# Patient Record
Sex: Male | Born: 1960 | Race: Black or African American | Hispanic: No | Smoking: Current every day smoker
Health system: Southern US, Community
[De-identification: ages and names within clinical notes are randomized; demographics above are authoritative.]

## PROBLEM LIST (undated history)

## (undated) DIAGNOSIS — F4310 Post-traumatic stress disorder, unspecified: Secondary | ICD-10-CM

## (undated) DIAGNOSIS — F419 Anxiety disorder, unspecified: Secondary | ICD-10-CM

## (undated) DIAGNOSIS — F32A Depression, unspecified: Secondary | ICD-10-CM

## (undated) DIAGNOSIS — F329 Major depressive disorder, single episode, unspecified: Secondary | ICD-10-CM

## (undated) DIAGNOSIS — F22 Delusional disorders: Secondary | ICD-10-CM

## (undated) DIAGNOSIS — F209 Schizophrenia, unspecified: Secondary | ICD-10-CM

## (undated) DIAGNOSIS — I10 Essential (primary) hypertension: Secondary | ICD-10-CM

## (undated) HISTORY — PX: OTHER SURGICAL HISTORY: SHX169

## (undated) HISTORY — PX: BACK SURGERY: SHX140

## (undated) HISTORY — PX: HERNIA REPAIR: SHX51

---

## 1998-02-02 ENCOUNTER — Emergency Department (HOSPITAL_COMMUNITY): Admission: EM | Admit: 1998-02-02 | Discharge: 1998-02-02 | Payer: Self-pay | Admitting: Emergency Medicine

## 2000-08-19 ENCOUNTER — Emergency Department (HOSPITAL_COMMUNITY): Admission: EM | Admit: 2000-08-19 | Discharge: 2000-08-19 | Payer: Self-pay | Admitting: Emergency Medicine

## 2000-08-19 ENCOUNTER — Encounter: Payer: Self-pay | Admitting: Emergency Medicine

## 2000-09-18 ENCOUNTER — Ambulatory Visit (HOSPITAL_BASED_OUTPATIENT_CLINIC_OR_DEPARTMENT_OTHER): Admission: RE | Admit: 2000-09-18 | Discharge: 2000-09-18 | Payer: Self-pay | Admitting: Orthopedic Surgery

## 2001-02-20 ENCOUNTER — Emergency Department (HOSPITAL_COMMUNITY): Admission: AC | Admit: 2001-02-20 | Discharge: 2001-02-21 | Payer: Self-pay

## 2001-03-25 ENCOUNTER — Ambulatory Visit (HOSPITAL_BASED_OUTPATIENT_CLINIC_OR_DEPARTMENT_OTHER): Admission: RE | Admit: 2001-03-25 | Discharge: 2001-03-25 | Payer: Self-pay | Admitting: Orthopedic Surgery

## 2002-11-10 ENCOUNTER — Inpatient Hospital Stay (HOSPITAL_COMMUNITY): Admission: EM | Admit: 2002-11-10 | Discharge: 2002-11-14 | Payer: Self-pay | Admitting: Psychiatry

## 2004-02-10 ENCOUNTER — Emergency Department (HOSPITAL_COMMUNITY): Admission: EM | Admit: 2004-02-10 | Discharge: 2004-02-11 | Payer: Self-pay

## 2005-04-22 ENCOUNTER — Emergency Department (HOSPITAL_COMMUNITY): Admission: EM | Admit: 2005-04-22 | Discharge: 2005-04-22 | Payer: Self-pay | Admitting: Emergency Medicine

## 2005-05-07 ENCOUNTER — Emergency Department (HOSPITAL_COMMUNITY): Admission: EM | Admit: 2005-05-07 | Discharge: 2005-05-07 | Payer: Self-pay | Admitting: Emergency Medicine

## 2005-05-09 ENCOUNTER — Encounter: Admission: RE | Admit: 2005-05-09 | Discharge: 2005-05-09 | Payer: Self-pay | Admitting: Gastroenterology

## 2005-05-28 ENCOUNTER — Encounter: Admission: RE | Admit: 2005-05-28 | Discharge: 2005-05-28 | Payer: Self-pay | Admitting: Neurosurgery

## 2005-06-02 ENCOUNTER — Ambulatory Visit (HOSPITAL_COMMUNITY): Admission: RE | Admit: 2005-06-02 | Discharge: 2005-06-03 | Payer: Self-pay | Admitting: Neurosurgery

## 2005-07-22 ENCOUNTER — Encounter: Admission: RE | Admit: 2005-07-22 | Discharge: 2005-07-22 | Payer: Self-pay | Admitting: Neurosurgery

## 2005-07-31 ENCOUNTER — Ambulatory Visit (HOSPITAL_COMMUNITY): Admission: RE | Admit: 2005-07-31 | Discharge: 2005-07-31 | Payer: Self-pay | Admitting: Gastroenterology

## 2006-11-08 ENCOUNTER — Emergency Department (HOSPITAL_COMMUNITY): Admission: EM | Admit: 2006-11-08 | Discharge: 2006-11-08 | Payer: Self-pay | Admitting: Emergency Medicine

## 2007-11-22 ENCOUNTER — Emergency Department (HOSPITAL_COMMUNITY): Admission: EM | Admit: 2007-11-22 | Discharge: 2007-11-22 | Payer: Self-pay | Admitting: Emergency Medicine

## 2008-07-01 ENCOUNTER — Emergency Department (HOSPITAL_COMMUNITY): Admission: EM | Admit: 2008-07-01 | Discharge: 2008-07-01 | Payer: Self-pay | Admitting: Emergency Medicine

## 2008-09-01 ENCOUNTER — Emergency Department (HOSPITAL_COMMUNITY): Admission: EM | Admit: 2008-09-01 | Discharge: 2008-09-01 | Payer: Self-pay | Admitting: Emergency Medicine

## 2008-09-05 ENCOUNTER — Emergency Department (HOSPITAL_COMMUNITY): Admission: EM | Admit: 2008-09-05 | Discharge: 2008-09-05 | Payer: Self-pay | Admitting: Emergency Medicine

## 2009-08-14 ENCOUNTER — Emergency Department (HOSPITAL_COMMUNITY): Admission: EM | Admit: 2009-08-14 | Discharge: 2009-08-15 | Payer: Self-pay | Admitting: Emergency Medicine

## 2010-07-20 ENCOUNTER — Encounter: Payer: Self-pay | Admitting: Gastroenterology

## 2010-10-10 LAB — URINALYSIS, ROUTINE W REFLEX MICROSCOPIC
Bilirubin Urine: NEGATIVE
Glucose, UA: NEGATIVE mg/dL
Ketones, ur: NEGATIVE mg/dL
Protein, ur: NEGATIVE mg/dL
pH: 6 (ref 5.0–8.0)

## 2010-10-10 LAB — DIFFERENTIAL
Basophils Relative: 1 % (ref 0–1)
Eosinophils Relative: 2 % (ref 0–5)
Monocytes Absolute: 0.5 10*3/uL (ref 0.1–1.0)
Monocytes Relative: 9 % (ref 3–12)
Neutro Abs: 3.4 10*3/uL (ref 1.7–7.7)

## 2010-10-10 LAB — POCT I-STAT, CHEM 8
BUN: 12 mg/dL (ref 6–23)
Calcium, Ion: 1.16 mmol/L (ref 1.12–1.32)
Chloride: 106 mEq/L (ref 96–112)
Glucose, Bld: 102 mg/dL — ABNORMAL HIGH (ref 70–99)
TCO2: 26 mmol/L (ref 0–100)

## 2010-10-10 LAB — CBC
HCT: 42.1 % (ref 39.0–52.0)
Hemoglobin: 15 g/dL (ref 13.0–17.0)
MCHC: 35.6 g/dL (ref 30.0–36.0)
RBC: 4.92 MIL/uL (ref 4.22–5.81)
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
## 2010-11-15 NOTE — Op Note (Signed)
NAMELYNDELL, ALLAIRE                 ACCOUNT NO.:  [id number]   MEDICAL RECORD NO.:  [id number]          PATIENT TYPE:  OIB   LOCATION:  3008                         FACILITY:  [facility]   PHYSICIAN:  Donalee Citrin, M.D.        DATE OF BIRTH:  1961/03/10   DATE OF PROCEDURE:  06/02/2005  DATE OF DISCHARGE:                                 OPERATIVE REPORT   PREOPERATIVE DIAGNOSIS:  Right-sided L5 radiculopathy from large herniated  disc L4-5 right.   PROCEDURE:  Lumbar laminectomy and microdiscectomy L4-5 right, microscopic  dissection of the right L5 nerve root and right sided microscopic  discectomy.   SURGEON:  Donalee Citrin, M.D.   ASSISTANT:  Kathaleen Maser. Pool, M.D.   ANESTHESIA:  General endotracheal anesthesia.   Patient is a very pleasant 50 year old gentleman who has had progressive  worsening back and right-sided leg pain radiating down the top of his foot  and his big toe with weakness in his EHL and progressive worsening limping  with the right leg.  Preoperative imaging showed a very large ruptured disc  causing severe spinal stenosis and foraminal stenosis on the L5 nerve root.  Patient is recommended laminectomy and microdiscectomy after he failed all  forms of conservative treatment and having progressive worsening weakness of  the right foot.  Risks and benefits explained to the patient who understands  and agrees to proceed forward.   Patient brought to the OR and was induced with general endotracheal  anesthesia.  Positioned prone on Wilson frame.  Back prepped and draped in  usual sterile fashion.  Preoperative x-ray localized the L4-5 disc space.  After infiltration of anesthesia with lidocaine with epinephrine, a midline  incision was made.  Bovie electrocautery was used to take down subcutaneous  tissue.  Subperiosteal dissection carried out at lamina of L4 and L5 on the  right.  Intraoperative x-ray confirmed localization of the L4-5 disc space  and __________ lamina of  L4 medial facet complex.  Element of L5 was removed  piecemeal fashion exposing the ligamentum flavum.  This was then removed in  piecemeal fashion under microscopic illumination exposing the thecal sac and  proximal L5 nerve root.  The L5 nerve root was markedly tense and displaced  by the large disc rupture so this was teased off of the disc with a #4  Penfield __________ with a D-Errico nerve root __________ annulotomy was  made.  Several large fragments of disc were removed medially from the disc  space and the central compartment.  Then using a downgoing Epstein curette  and pituitary rongeurs, the remainder of the disc space was cleaned out  extensively, both medially, laterally and cephalocaudally.  Then at the end  of the discectomy, the neural foramen was explored with a coronary dilator  and hockey stick and there was no further stenosis appreciated.  Wound was  then copiously irrigated and meticulous  hemostasis was maintained.  Gelfoam was overlayed on top of the dura.  The  muscle and fascia reapproximated with interrupted Vicryls and the skin was  closed with  running 4-0 subcuticular.  Benzoin and Steri-Strips applied.  The patient went to the recovery room in stable condition.           ______________________________  Donalee Citrin, M.D.     GC/MEDQ  D:  06/02/2005  T:  06/03/2005  Job:  [id number]
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
## 2010-11-15 NOTE — Op Note (Signed)
[place]  Patient:    Calvin Erickson, Calvin Erickson                        MRN: [id number] Proc. Date: 09/18/00 Adm. Date:  [id number] Attending:  Milly Jakob                           Operative Report  PREOPERATIVE DIAGNOSIS:  Severe ankle pain with loose bodies and significant degenerative spur formation.  POSTOPERATIVE DIAGNOSIS: Severe ankle pain with loose bodies and significant degenerative spur formation.  OPERATION:  Debridement of significant osteophytic spurs and removal of loose body via ankle arthroscopy.  SURGEON:  Harvie Junior, M.D.  ASSISTANT:  Currie Paris. Thedore Mins.  ANESTHESIA:  General.  INDICATIONS:  Calvin Erickson is a 50 year old male with a long history of having intermittent catching and locking ankle pain.  He has been evaluated and noted to have fairly significant degenerative spurring in the ankle but, because of intermittent catching and locking pain, he was evaluated and had a steroid injection done.  The steroid injection gave him excellent initial relief, but his symptoms recurred.  Because of that, he was brought to the operating room for operative ankle arthroscopy.  DESCRIPTION OF PROCEDURE:    The patient was taken to the operating room. After adequate anesthesia was obtained with a general anesthesia, the patient was placed supine on the operating room table.  The left leg was then prepped and draped in the usual sterile fashion.  The patient was placed into the stirrup leg holder.  Following this, examination of the ankle revealed that there was multiple osteocartilaginous loose bodies which were removed initially.  There was one large osteocartilaginous loose body in the lateral gutter which was grasped but was unable to be removed through the portal.  The portal was enlarged, and then it was removed.  Attention was then turned to the rest of the ankle joint where there was noted to be a fairly significant  degenerative spur on the anteromedial talus and a corresponding spur on the anteromedial tibia.  These spurs were debrided with a motorized 4.2 mm hooded vortex bur, and the fluoroscope was used to localize and make sure that the osteophytes were removed appropriately.  Following this, x-ray was used to make sure that the osteophytes were removed from the talus as well as from the tibia.  Once this had been accomplished, the remaining ankle joint was examined.  The medial gutter was felt to be within normal limits.  There were two large cartilaginous pieces on his AP x-ray felt to be within the deltoid ligament.  This was probed thoroughly on the medial side, and these actually were not loose pieces, but they were within the substance of the deltoid ligament and not able to be visualized within the ankle joint.  Attention was then turned to the lateral gutter which had been opened quite nicely with the arthroscopy, and the loose pieces were removed there. Attention was then turned centrally where the cartilage actually looked reasonable on the talus and the tibia.  At this point, the ankle was copiously irrigated and suction dried.  The arthroscopic portals were closed deep with 4-0 Vicryl interrupted suture and then subcutaneous tissue with 4-0 nylon interrupted suture.  Sterile compressive dressing was applied and a posterior plaster splint, and the patient was taken to the recovery room where he was noted to be  satisfactory condition.  Estimated blood loss during procedure was none.  The procedure was done under tourniquet control, and this was accomplished with Esmarch exsanguination of the extremity and inflating the blood pressure tourniquet to 325 mmHg, and the approximate tourniquet time was 1 hour and 30 minutes. DD:  09/18/00 TD:  09/19/00 Job: [id number]
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
## 2010-12-21 ENCOUNTER — Emergency Department (HOSPITAL_COMMUNITY): Payer: Medicare Other

## 2010-12-21 ENCOUNTER — Inpatient Hospital Stay (HOSPITAL_COMMUNITY)
Admission: EM | Admit: 2010-12-21 | Discharge: 2010-12-24 | DRG: 501 | Disposition: A | Discharge status: Home / Self Care | Payer: Medicare Other | Attending: Internal Medicine | Admitting: Internal Medicine

## 2010-12-21 DIAGNOSIS — I10 Essential (primary) hypertension: Secondary | ICD-10-CM | POA: Diagnosis present

## 2010-12-21 DIAGNOSIS — F1010 Alcohol abuse, uncomplicated: Secondary | ICD-10-CM | POA: Diagnosis present

## 2010-12-21 DIAGNOSIS — S51809A Unspecified open wound of unspecified forearm, initial encounter: Secondary | ICD-10-CM | POA: Diagnosis present

## 2010-12-21 DIAGNOSIS — D62 Acute posthemorrhagic anemia: Secondary | ICD-10-CM | POA: Diagnosis not present

## 2010-12-21 DIAGNOSIS — Z1881 Retained glass fragments: Secondary | ICD-10-CM

## 2010-12-21 DIAGNOSIS — F4310 Post-traumatic stress disorder, unspecified: Secondary | ICD-10-CM | POA: Diagnosis present

## 2010-12-21 DIAGNOSIS — F209 Schizophrenia, unspecified: Secondary | ICD-10-CM | POA: Diagnosis present

## 2010-12-21 DIAGNOSIS — Z91199 Patient's noncompliance with other medical treatment and regimen due to unspecified reason: Secondary | ICD-10-CM

## 2010-12-21 DIAGNOSIS — Z9119 Patient's noncompliance with other medical treatment and regimen: Secondary | ICD-10-CM

## 2010-12-21 LAB — URINE MICROSCOPIC-ADD ON

## 2010-12-21 LAB — URINALYSIS, ROUTINE W REFLEX MICROSCOPIC
Hgb urine dipstick: NEGATIVE
Ketones, ur: 15 mg/dL — AB
Protein, ur: 30 mg/dL — AB
Urobilinogen, UA: 0.2 mg/dL (ref 0.0–1.0)

## 2010-12-21 LAB — BASIC METABOLIC PANEL
BUN: 14 mg/dL (ref 6–23)
Calcium: 8.5 mg/dL (ref 8.4–10.5)
GFR calc Af Amer: >60 mL/min (ref >60)
GFR calc non Af Amer: >60 mL/min (ref >60)
Potassium: 4.1 mEq/L (ref 3.5–5.1)
Sodium: 140 mEq/L (ref 135–145)

## 2010-12-21 LAB — RAPID URINE DRUG SCREEN, HOSP PERFORMED
Amphetamines: NOT DETECTED
Barbiturates: NOT DETECTED
Benzodiazepines: NOT DETECTED
Cocaine: NOT DETECTED
Tetrahydrocannabinol: NOT DETECTED

## 2010-12-21 LAB — DIFFERENTIAL
Basophils Relative: 0 % (ref 0–1)
Eosinophils Absolute: 0 10*3/uL (ref 0.0–0.7)
Neutrophils Relative %: 88 % — ABNORMAL HIGH (ref 43–77)

## 2010-12-21 LAB — CBC
MCH: 29.5 pg (ref 26.0–34.0)
Platelets: 181 10*3/uL (ref 150–400)
RBC: 4.04 MIL/uL — ABNORMAL LOW (ref 4.22–5.81)
RDW: 13.3 % (ref 11.5–15.5)
WBC: 7.1 10*3/uL (ref 4.0–10.5)

## 2010-12-21 LAB — ACETAMINOPHEN LEVEL: Acetaminophen (Tylenol), Serum: <15 ug/mL (ref 10–30)

## 2010-12-22 DIAGNOSIS — F29 Unspecified psychosis not due to a substance or known physiological condition: Secondary | ICD-10-CM

## 2010-12-22 LAB — COMPREHENSIVE METABOLIC PANEL
AST: 21 U/L (ref 0–37)
Albumin: 3.4 g/dL — ABNORMAL LOW (ref 3.5–5.2)
Alkaline Phosphatase: 61 U/L (ref 39–117)
Chloride: 101 mEq/L (ref 96–112)
Potassium: 4.2 mEq/L (ref 3.5–5.1)
Sodium: 136 mEq/L (ref 135–145)
Total Bilirubin: 0.5 mg/dL (ref 0.3–1.2)
Total Protein: 6 g/dL (ref 6.0–8.3)

## 2010-12-22 LAB — CBC
MCHC: 34.8 g/dL (ref 30.0–36.0)
Platelets: 198 10*3/uL (ref 150–400)
RDW: 13.4 % (ref 11.5–15.5)
WBC: 9.5 10*3/uL (ref 4.0–10.5)

## 2010-12-22 LAB — ETHANOL: Alcohol, Ethyl (B): <11 mg/dL (ref 0–11)
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
## 2010-12-24 DIAGNOSIS — F29 Unspecified psychosis not due to a substance or known physiological condition: Secondary | ICD-10-CM

## 2010-12-24 LAB — BASIC METABOLIC PANEL
BUN: 9 mg/dL (ref 6–23)
CO2: 28 mEq/L (ref 19–32)
Calcium: 8.4 mg/dL (ref 8.4–10.5)
Chloride: 103 mEq/L (ref 96–112)
Creatinine, Ser: 0.95 mg/dL (ref 0.50–1.35)
GFR calc Af Amer: >60 mL/min (ref >60)
GFR calc non Af Amer: >60 mL/min (ref >60)
Glucose, Bld: 105 mg/dL — ABNORMAL HIGH (ref 70–99)
Potassium: 3.9 mEq/L (ref 3.5–5.1)
Sodium: 136 mEq/L (ref 135–145)

## 2010-12-24 LAB — CBC
HCT: 27.3 % — ABNORMAL LOW (ref 39.0–52.0)
Hemoglobin: 9.6 g/dL — ABNORMAL LOW (ref 13.0–17.0)
MCHC: 35.2 g/dL (ref 30.0–36.0)
MCV: 85 fL (ref 78.0–100.0)
RDW: 13 % (ref 11.5–15.5)

## 2010-12-25 ENCOUNTER — Ambulatory Visit: Payer: Medicare Other | Attending: Orthopedic Surgery | Admitting: Occupational Therapy

## 2010-12-25 DIAGNOSIS — IMO0001 Reserved for inherently not codable concepts without codable children: Secondary | ICD-10-CM | POA: Insufficient documentation

## 2010-12-25 DIAGNOSIS — M25549 Pain in joints of unspecified hand: Secondary | ICD-10-CM | POA: Insufficient documentation

## 2010-12-25 DIAGNOSIS — M2560 Stiffness of unspecified joint, not elsewhere classified: Secondary | ICD-10-CM | POA: Insufficient documentation

## 2010-12-25 DIAGNOSIS — M6281 Muscle weakness (generalized): Secondary | ICD-10-CM | POA: Insufficient documentation

## 2010-12-30 ENCOUNTER — Encounter: Payer: Medicare Other | Admitting: Occupational Therapy

## 2010-12-30 ENCOUNTER — Emergency Department (HOSPITAL_COMMUNITY)
Admission: EM | Admit: 2010-12-30 | Discharge: 2010-12-30 | Disposition: A | Discharge status: Home / Self Care | Payer: Medicare Other | Attending: Emergency Medicine | Admitting: Emergency Medicine

## 2010-12-30 DIAGNOSIS — M79609 Pain in unspecified limb: Secondary | ICD-10-CM | POA: Insufficient documentation

## 2010-12-30 DIAGNOSIS — M545 Low back pain, unspecified: Secondary | ICD-10-CM | POA: Insufficient documentation

## 2010-12-30 DIAGNOSIS — M5380 Other specified dorsopathies, site unspecified: Secondary | ICD-10-CM | POA: Insufficient documentation

## 2010-12-30 LAB — URINALYSIS, ROUTINE W REFLEX MICROSCOPIC
Bilirubin Urine: NEGATIVE
Hgb urine dipstick: NEGATIVE
Ketones, ur: NEGATIVE mg/dL
Nitrite: NEGATIVE
Specific Gravity, Urine: 1.025 (ref 1.005–1.030)
pH: 6.5 (ref 5.0–8.0)
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
## 2011-01-06 ENCOUNTER — Ambulatory Visit: Payer: Medicare Other | Attending: Orthopedic Surgery | Admitting: Occupational Therapy

## 2011-01-06 DIAGNOSIS — IMO0001 Reserved for inherently not codable concepts without codable children: Secondary | ICD-10-CM | POA: Insufficient documentation

## 2011-01-06 DIAGNOSIS — M2560 Stiffness of unspecified joint, not elsewhere classified: Secondary | ICD-10-CM | POA: Insufficient documentation

## 2011-01-06 DIAGNOSIS — M6281 Muscle weakness (generalized): Secondary | ICD-10-CM | POA: Insufficient documentation

## 2011-01-06 DIAGNOSIS — M25549 Pain in joints of unspecified hand: Secondary | ICD-10-CM | POA: Insufficient documentation

## 2011-01-08 ENCOUNTER — Ambulatory Visit: Payer: Medicare Other | Admitting: Occupational Therapy
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
## 2011-01-14 ENCOUNTER — Ambulatory Visit: Payer: Medicare Other | Admitting: Occupational Therapy
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
## 2011-01-21 NOTE — Op Note (Signed)
  [name], Calvin Erickson NO.:  [id number]  MEDICAL RECORD NO.:  [id number]  LOCATION:  5522                         FACILITY:  [facility]  PHYSICIAN:  Artist Pais. Dupree Givler, M.D.DATE OF BIRTH:  1960-09-29  DATE OF PROCEDURE:  12/21/2010 DATE OF DISCHARGE:                              OPERATIVE REPORT   PREOPERATIVE DIAGNOSIS:  Complex laceration, right forearm.  POSTOPERATIVE DIAGNOSIS:  Complex laceration, right forearm.  PROCEDURE:  Exploration and repair with removal of significant amounts of glass as well as debridement of devitalized tissue and primary repair of EPL, EDC x4, EIP, EDQ, ECU, and FCU, right forearm.  SURGEON:  Artist Pais. Mina Marble, MD  ASSISTANT:  None.  ANESTHESIA:  General.  TOURNIQUET TIME:  An hour and 25 minutes.  COMPLICATIONS:  None.  DRAINS:  None.  The patient was taken to operating suite after induction of adequate general anesthesia.  Right upper extremity was prepped and draped insterile fashion.  Esmarch was used to exsanguinate the limb.  Tourniquet was inflated to 250 mmHg.  At this point in time, a complex laceration of the dorsal ulnar border of the forearm and wrist area was explored with complex flaps, some proximally based and distally based.  These were carefully retracted.  There was significant amount of glass in the wound.  The proximal aspect of the wound was extended in the midline, volar aspect in the midline as well along the FCU tendon.  Exploration the volar side revealed a laceration about 70% of the FCU tendon. Neurovascular bundle was intact.  This was repaired with 3-0 FiberWire suture and horizontal mattress sutures x2.  The wound was then explored dorsally.  3 L of normal saline was used to pulse lavage the wound clean including again large amounts of glass, which were debrided out of the wound.  At this point in time, inspection revealed complete lacerations of the third, fourth, fifth, and sixth dorsal  compartments.  All the extensor tendons were carefully identified.  There was some segmental damage to the EDC tendons to the long and ring finger.  The distal lacerations were repaired with 3-0 FiberWire and horizontal mattress sutures.  The proximal junctures were all repaired with the same 3-0 FiberWire in combination of horizontal mattress sutures and running locked epitendinous-type sutures using the 3-0 FiberWire suture.  There was some devitalized muscle that was debrided, and the wound was then thoroughly irrigated and loosely closed with 4-0 Vicryl Rapide sutures. Xeroform, 4 x 4s, fluffs, and a splint was applied with the wrist in extension, the MP joints in extension, and the thumb in extension to protect the repairs.  The patient tolerated the procedure well and went to the recovery room in stable fashion.     Artist Pais Mina Marble, M.D.     MAW/MEDQ  D:  12/22/2010  T:  12/22/2010  Job:  [id number]  Electronically Signed by Dairl Ponder M.D. on 01/21/2011 08:37:42 PM

## 2011-01-22 ENCOUNTER — Encounter: Payer: Medicare Other | Admitting: Occupational Therapy

## 2011-01-24 ENCOUNTER — Encounter: Payer: Medicare Other | Admitting: Occupational Therapy

## 2011-01-28 ENCOUNTER — Encounter: Payer: Medicare Other | Admitting: Occupational Therapy

## 2011-01-30 ENCOUNTER — Encounter: Payer: Medicare Other | Admitting: Occupational Therapy

## 2011-02-04 ENCOUNTER — Encounter: Payer: Medicare Other | Admitting: Occupational Therapy

## 2011-02-06 ENCOUNTER — Encounter: Payer: Medicare Other | Admitting: Occupational Therapy

## 2012-09-13 IMAGING — CR DG ELBOW COMPLETE 3+V*R*
4 series · 4 of 4 positions shown · non-contrast
Comparison: None.

CLINICAL DATA: Laceration with glass

RIGHT ELBOW - COMPLETE 3+ VIEW

[view not recorded (1 of 4)]
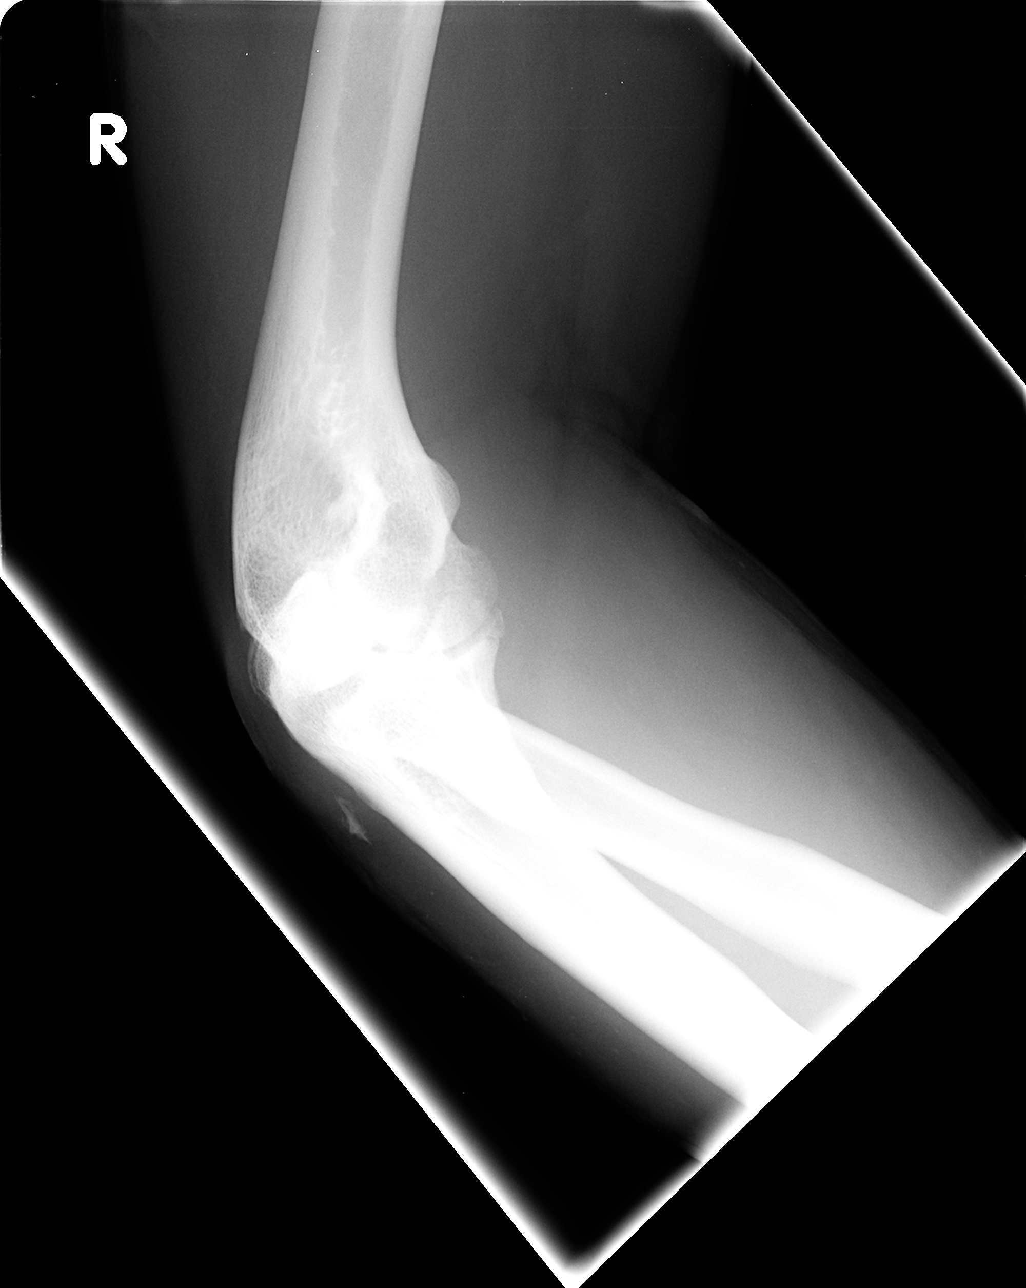

[view not recorded (2 of 4)]
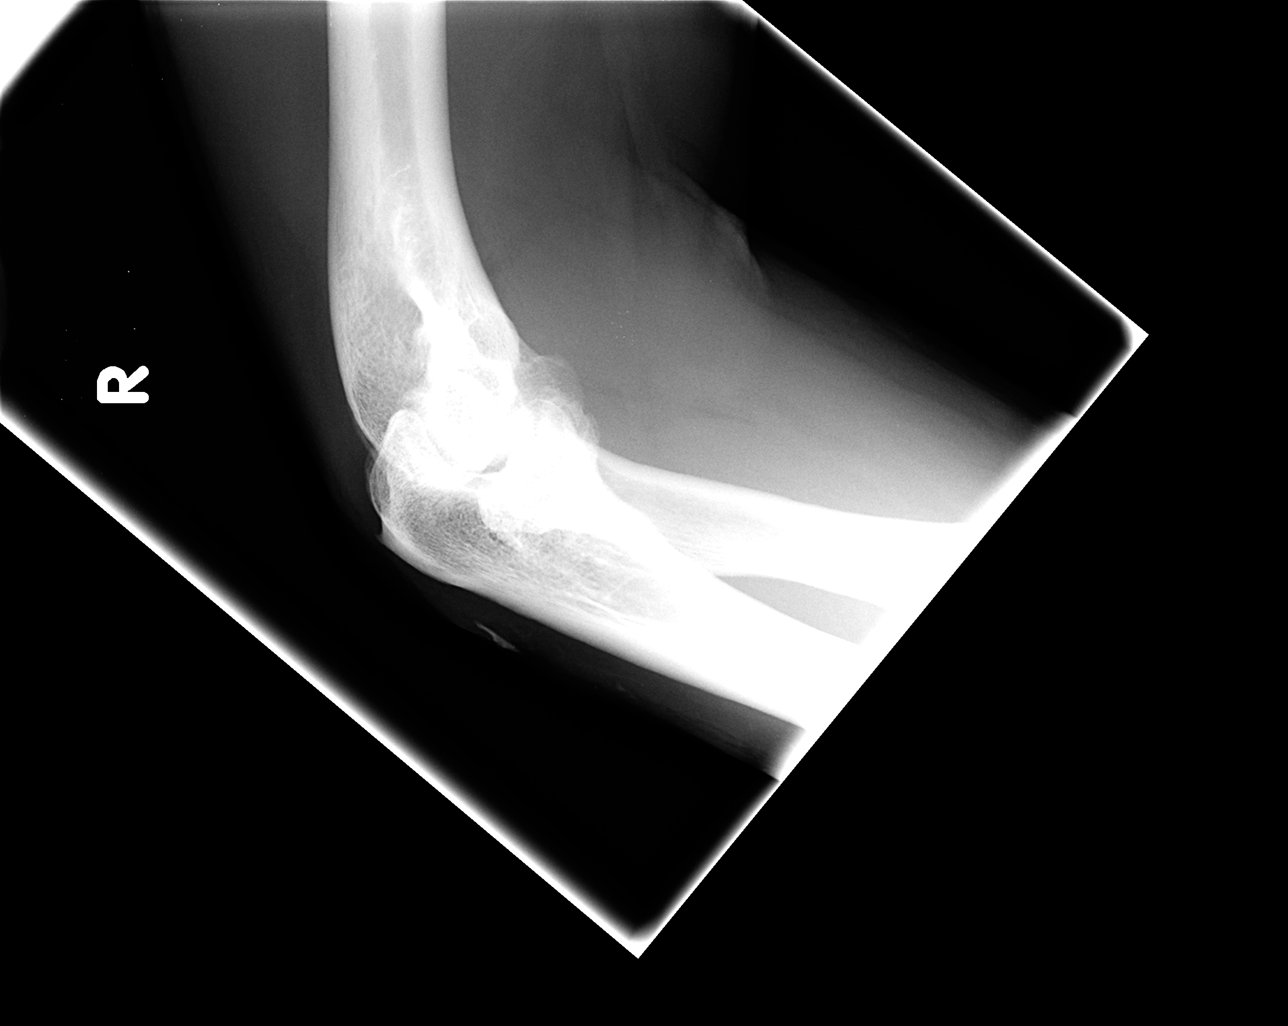

[view not recorded (3 of 4)]
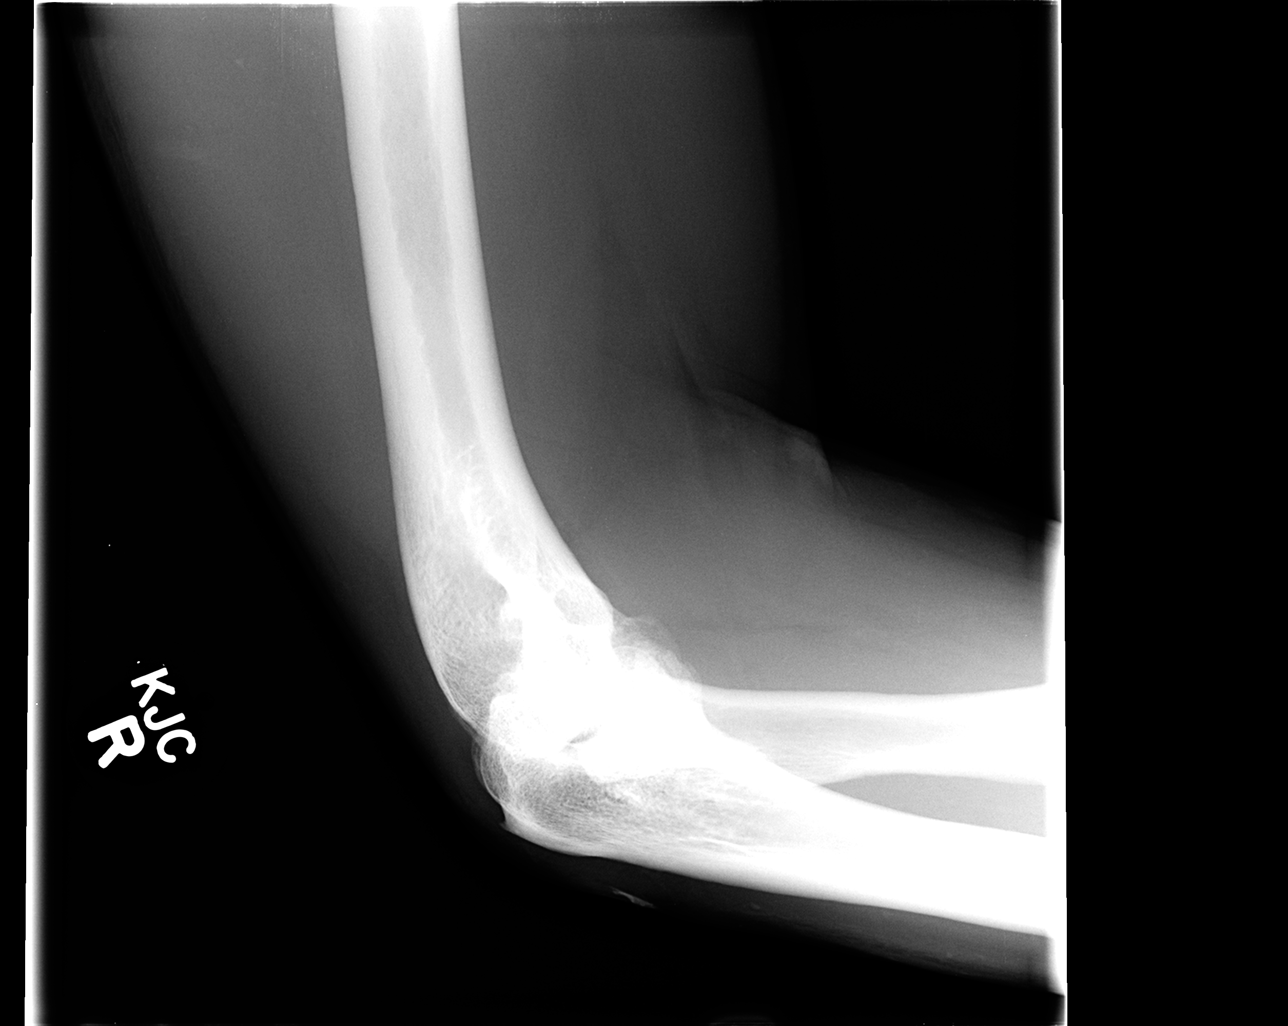

[view not recorded (4 of 4)]
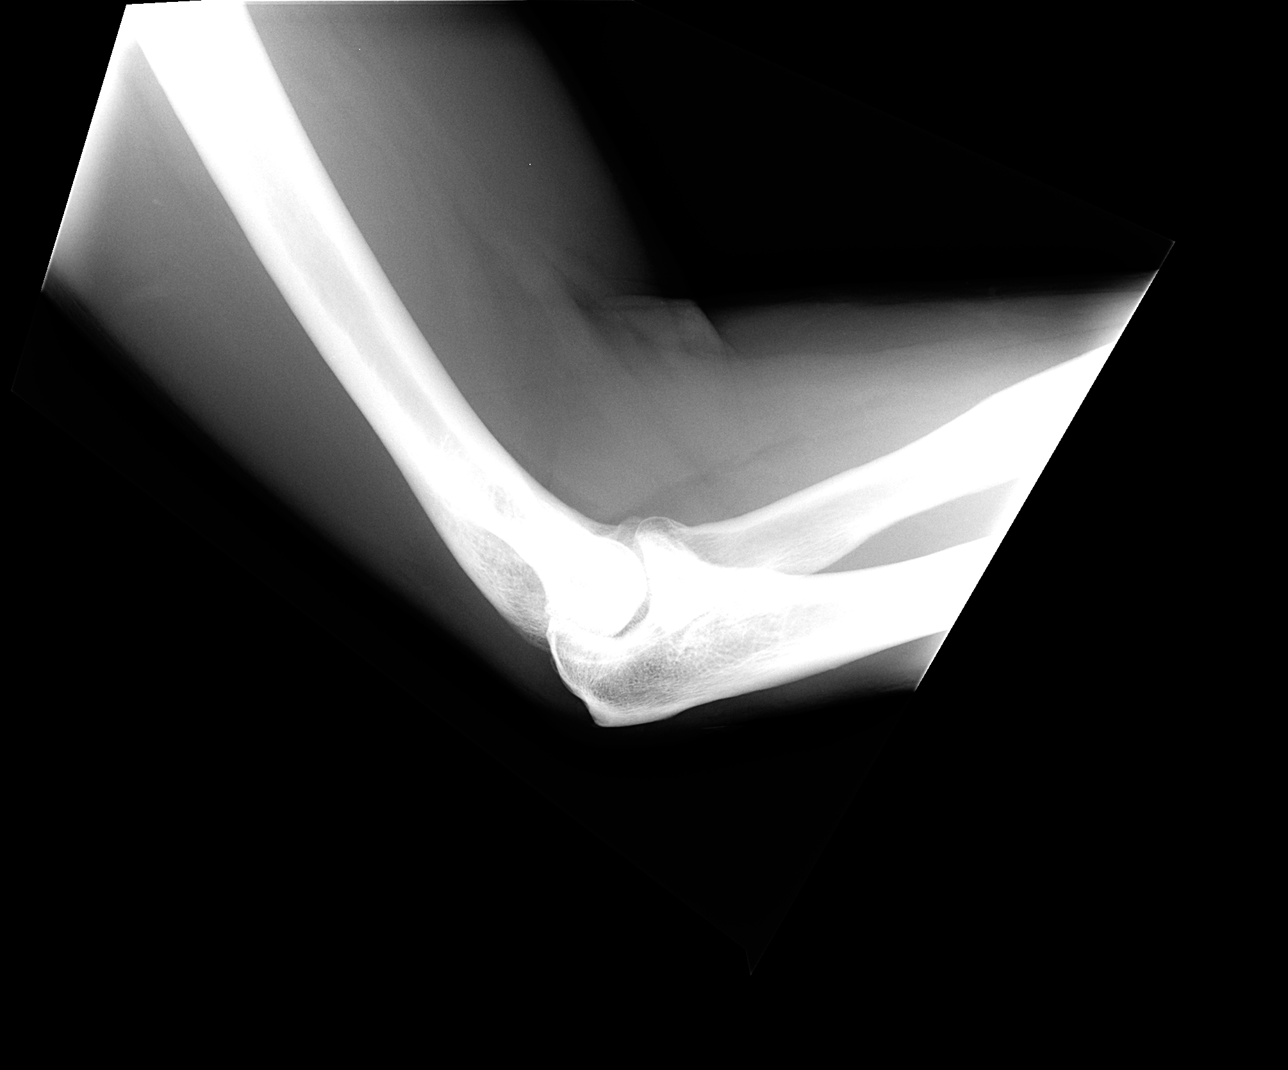

[4 of 4 positions shown; findings below may reference images not displayed]

FINDINGS: Linear radiopaque foreign body is noted over the dorsal
soft tissues overlying the elbow.  Allowing for suboptimal
positioning, no fracture or dislocation is seen.  Positioning is
markedly suboptimal due to patient inability to cooperate with the
examination.
IMPRESSION: Curvilinear radiopaque foreign body of the dorsal soft tissues over
the level.

## 2012-09-13 IMAGING — CR DG FOREARM 2V*R*
2 series · 2 of 2 positions shown · non-contrast
Comparison: None.

CLINICAL DATA: Laceration to the right forearm with glass

RIGHT FOREARM - 2 VIEW

[view not recorded (1 of 2)]
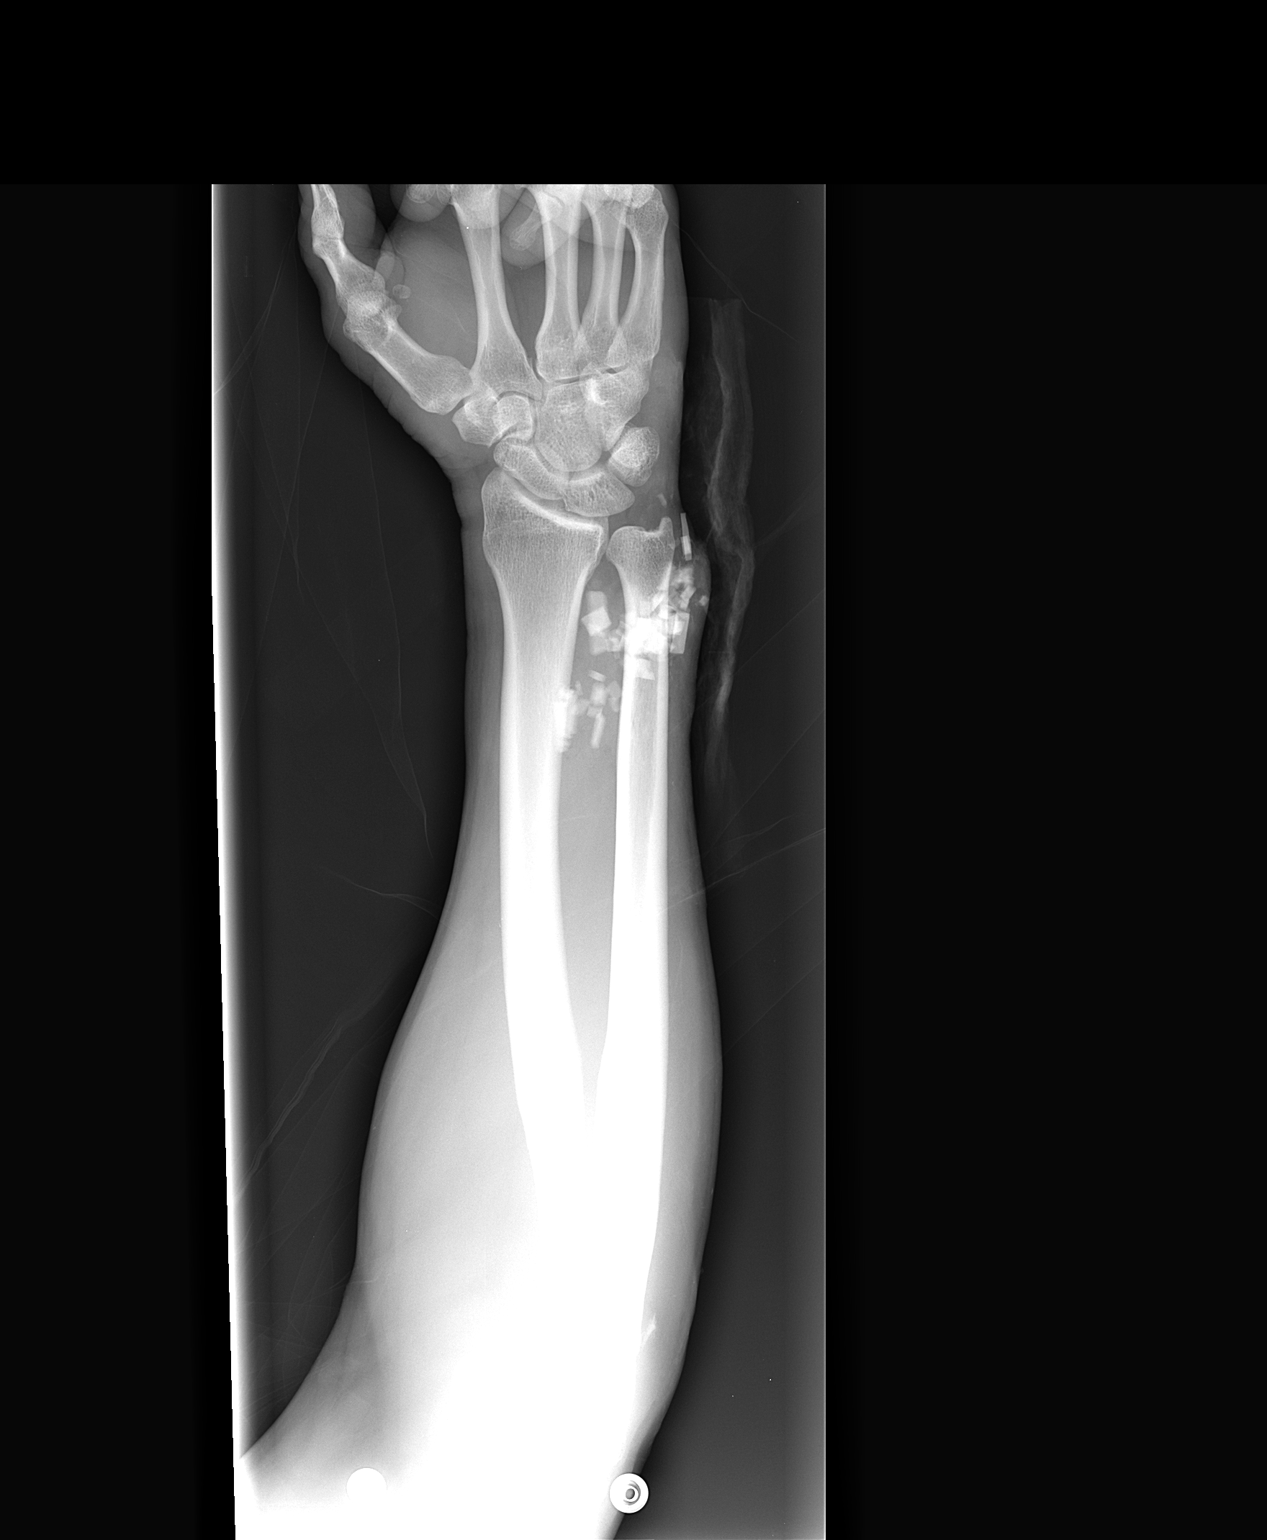

[view not recorded (2 of 2)]
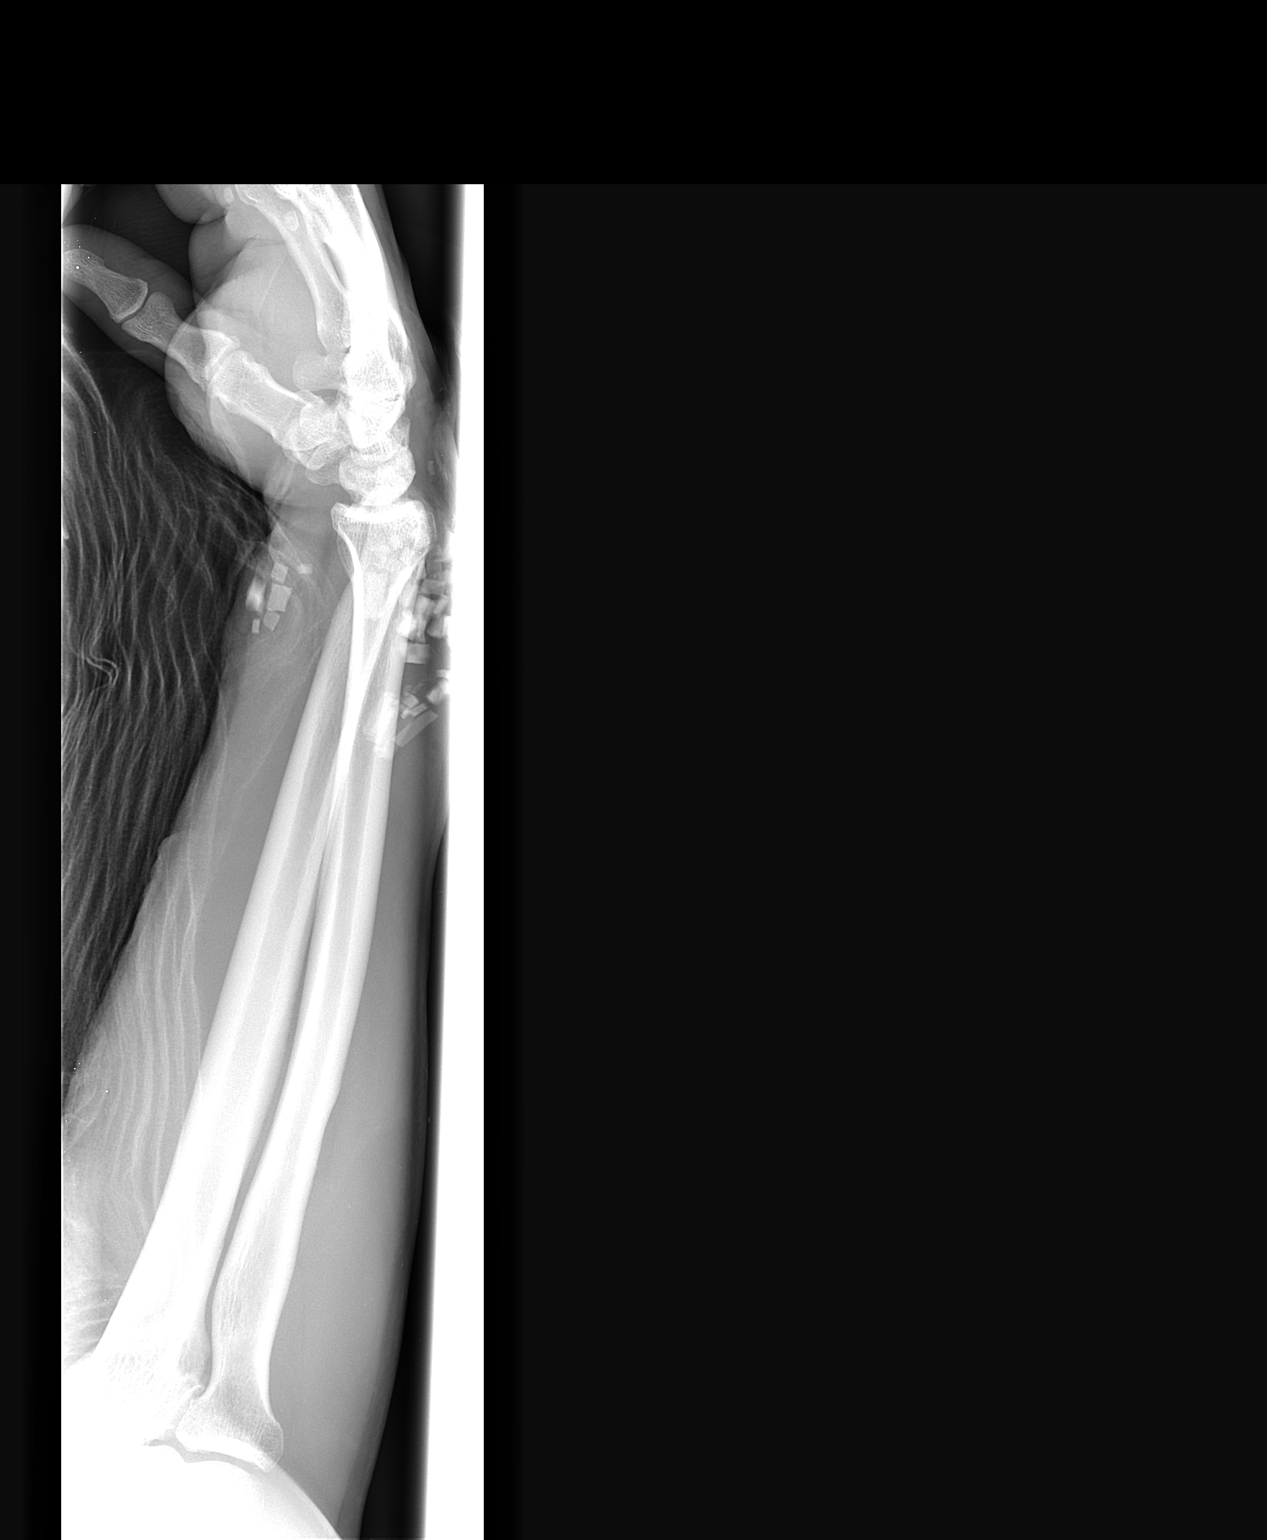

[2 of 2 positions shown; findings below may reference images not displayed]

FINDINGS: Multiple radiopaque foreign bodies overlie the soft
tissues over the distal right forearm.  Overlying soft tissue
deformity is noted.  Some detail is obscured by bandage material.
No displaced fracture is seen.  There is also possible radiopaque
foreign body projecting over the proximal forearm, seen on the
frontal view.
IMPRESSION: Multiple radiopaque foreign bodies over the soft tissues of the
forearm as above.  No visualized fracture line.  Consider re-
imaging after removal foreign bodies due to possible obscuration of
fracture.

## 2013-09-12 ENCOUNTER — Encounter (HOSPITAL_COMMUNITY): Payer: Self-pay | Admitting: Emergency Medicine

## 2013-09-12 ENCOUNTER — Emergency Department (EMERGENCY_DEPARTMENT_HOSPITAL)
Admission: EM | Admit: 2013-09-12 | Discharge: 2013-09-13 | Disposition: A | Discharge status: Other Acute Inpatient Hospital | Payer: Medicare Other | Source: Home / Self Care

## 2013-09-12 DIAGNOSIS — F22 Delusional disorders: Secondary | ICD-10-CM

## 2013-09-12 DIAGNOSIS — F17200 Nicotine dependence, unspecified, uncomplicated: Secondary | ICD-10-CM | POA: Insufficient documentation

## 2013-09-12 DIAGNOSIS — F4310 Post-traumatic stress disorder, unspecified: Secondary | ICD-10-CM | POA: Diagnosis present

## 2013-09-12 DIAGNOSIS — R45850 Homicidal ideations: Secondary | ICD-10-CM

## 2013-09-12 DIAGNOSIS — F419 Anxiety disorder, unspecified: Secondary | ICD-10-CM | POA: Diagnosis present

## 2013-09-12 DIAGNOSIS — F209 Schizophrenia, unspecified: Secondary | ICD-10-CM | POA: Diagnosis present

## 2013-09-12 DIAGNOSIS — F1010 Alcohol abuse, uncomplicated: Secondary | ICD-10-CM | POA: Insufficient documentation

## 2013-09-12 DIAGNOSIS — R45851 Suicidal ideations: Secondary | ICD-10-CM

## 2013-09-12 DIAGNOSIS — F200 Paranoid schizophrenia: Secondary | ICD-10-CM

## 2013-09-12 DIAGNOSIS — F29 Unspecified psychosis not due to a substance or known physiological condition: Secondary | ICD-10-CM

## 2013-09-12 DIAGNOSIS — F1210 Cannabis abuse, uncomplicated: Secondary | ICD-10-CM | POA: Insufficient documentation

## 2013-09-12 DIAGNOSIS — F411 Generalized anxiety disorder: Secondary | ICD-10-CM | POA: Insufficient documentation

## 2013-09-12 HISTORY — DX: Schizophrenia, unspecified: F20.9

## 2013-09-12 HISTORY — DX: Anxiety disorder, unspecified: F41.9

## 2013-09-12 HISTORY — DX: Depression, unspecified: F32.A

## 2013-09-12 HISTORY — DX: Post-traumatic stress disorder, unspecified: F43.10

## 2013-09-12 HISTORY — DX: Major depressive disorder, single episode, unspecified: F32.9

## 2013-09-12 HISTORY — DX: Delusional disorders: F22

## 2013-09-12 LAB — CBC
HCT: 45 % (ref 39.0–52.0)
Hemoglobin: 16 g/dL (ref 13.0–17.0)
MCH: 31.9 pg (ref 26.0–34.0)
MCHC: 35.6 g/dL (ref 30.0–36.0)
MCV: 89.6 fL (ref 78.0–100.0)
Platelets: 263 K/uL (ref 150–400)
RBC: 5.02 MIL/uL (ref 4.22–5.81)
RDW: 14 % (ref 11.5–15.5)
WBC: 5.4 K/uL (ref 4.0–10.5)

## 2013-09-12 LAB — COMPREHENSIVE METABOLIC PANEL
ALBUMIN: 4.4 g/dL (ref 3.5–5.2)
ALK PHOS: 74 U/L (ref 39–117)
ALT: 17 U/L (ref 0–53)
AST: 34 U/L (ref 0–37)
BUN: 9 mg/dL (ref 6–23)
CO2: 21 mEq/L (ref 19–32)
Calcium: 9.5 mg/dL (ref 8.4–10.5)
Chloride: 99 mEq/L (ref 96–112)
Creatinine, Ser: 0.89 mg/dL (ref 0.50–1.35)
GFR calc Af Amer: >90 mL/min (ref >90)
GFR calc non Af Amer: >90 mL/min (ref >90)
Glucose, Bld: 96 mg/dL (ref 70–99)
POTASSIUM: 4.7 meq/L (ref 3.7–5.3)
SODIUM: 136 meq/L — AB (ref 137–147)
TOTAL PROTEIN: 7.8 g/dL (ref 6.0–8.3)
Total Bilirubin: 0.5 mg/dL (ref 0.3–1.2)

## 2013-09-12 LAB — RAPID URINE DRUG SCREEN, HOSP PERFORMED
Amphetamines: NOT DETECTED
BARBITURATES: NOT DETECTED
BENZODIAZEPINES: NOT DETECTED
COCAINE: NOT DETECTED
Opiates: NOT DETECTED
TETRAHYDROCANNABINOL: POSITIVE — AB

## 2013-09-12 LAB — ETHANOL: Alcohol, Ethyl (B): <11 mg/dL (ref 0–11)

## 2013-09-12 LAB — ACETAMINOPHEN LEVEL: Acetaminophen (Tylenol), Serum: <15 ug/mL (ref 10–30)

## 2013-09-12 LAB — SALICYLATE LEVEL: Salicylate Lvl: <2 mg/dL — ABNORMAL LOW (ref 2.8–20.0)

## 2013-09-12 MED ORDER — IBUPROFEN 200 MG PO TABS: 600.0000 mg | ORAL_TABLET | Freq: Three times a day (TID) | ORAL | Status: DC | PRN

## 2013-09-12 MED ORDER — SERTRALINE HCL 50 MG PO TABS
100.0000 mg | ORAL_TABLET | Freq: Every day | ORAL | Status: DC
  Administered 2013-09-13: 100 mg via ORAL
  Filled 2013-09-12: qty 2

## 2013-09-12 MED ORDER — PRAZOSIN HCL 2 MG PO CAPS
2.0000 mg | ORAL_CAPSULE | Freq: Every day | ORAL | Status: DC
  Administered 2013-09-12: 2 mg via ORAL
  Filled 2013-09-12 (×2): qty 1

## 2013-09-12 MED ORDER — ONDANSETRON HCL 4 MG PO TABS: 4.0000 mg | ORAL_TABLET | Freq: Three times a day (TID) | ORAL | Status: DC | PRN

## 2013-09-12 MED ORDER — NICOTINE 21 MG/24HR TD PT24
21.0000 mg | MEDICATED_PATCH | Freq: Every day | TRANSDERMAL | Status: DC
  Filled 2013-09-12: qty 1

## 2013-09-12 MED ORDER — CHLORDIAZEPOXIDE HCL 25 MG PO CAPS
25.0000 mg | ORAL_CAPSULE | Freq: Three times a day (TID) | ORAL | Status: DC | PRN
Start: 2013-09-12 — End: 2013-09-13

## 2013-09-12 MED ORDER — ZOLPIDEM TARTRATE 5 MG PO TABS
5.0000 mg | ORAL_TABLET | Freq: Every evening | ORAL | Status: DC | PRN
  Administered 2013-09-12: 5 mg via ORAL
  Filled 2013-09-12: qty 1

## 2013-09-12 MED ORDER — ARIPIPRAZOLE 15 MG PO TABS
15.0000 mg | ORAL_TABLET | Freq: Every day | ORAL | Status: DC
  Administered 2013-09-13: 15 mg via ORAL
  Filled 2013-09-12 (×2): qty 1

## 2013-09-12 MED ORDER — HYDROCHLOROTHIAZIDE 12.5 MG PO CAPS
25.0000 mg | ORAL_CAPSULE | Freq: Every day | ORAL | Status: DC
  Administered 2013-09-12 – 2013-09-13 (×2): 25 mg via ORAL
  Filled 2013-09-12 (×2): qty 2

## 2013-09-12 NOTE — ED Notes (Signed)
Pt and belongings have been wanded 

## 2013-09-12 NOTE — ED Notes (Signed)
Patient has shoes,socks,underwear,two tee shirts,jeans,cell phone,cap and one pair of gloves. [place] [address] [facility]
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
## 2013-09-12 NOTE — Progress Notes (Addendum)
Patient is appropriate for the 400 hall, engages appropriately.  Calvin Erickson HeadsJamison Y. Ranyia Witting, PMH-NP 09/12/2013

## 2013-09-12 NOTE — ED Notes (Signed)
Initial Contact - pt to HallA, changed to scrubs by med tech.  Pt reports hx PTSD and c/o SI/HI towards no one specific.  Pt denies plan.  Calm/coop, good eye contact.  Skin PWD.  MAEI, ambulatory with steady gait.  NAD.

## 2013-09-12 NOTE — ED Notes (Signed)
Per pt, he is homicidal and suicidal.  Has PTSD and has feelings before.  HI towards no one specific  Pt hearing demons in his head.
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
## 2013-09-12 NOTE — ED Notes (Signed)
Pt moved to RM34, belongings given to psych RN.  NAD upon transfer of care.

## 2013-09-13 ENCOUNTER — Encounter (HOSPITAL_COMMUNITY): Payer: Self-pay

## 2013-09-13 ENCOUNTER — Inpatient Hospital Stay (HOSPITAL_COMMUNITY)
Admission: AD | Admit: 2013-09-13 | Discharge: 2013-09-20 | DRG: 882 | Disposition: A | Discharge status: Home / Self Care | Payer: Medicare Other | Source: Intra-hospital | Attending: Psychiatry | Admitting: Psychiatry

## 2013-09-13 DIAGNOSIS — F1020 Alcohol dependence, uncomplicated: Secondary | ICD-10-CM | POA: Diagnosis present

## 2013-09-13 DIAGNOSIS — F200 Paranoid schizophrenia: Secondary | ICD-10-CM | POA: Diagnosis present

## 2013-09-13 DIAGNOSIS — Z79899 Other long term (current) drug therapy: Secondary | ICD-10-CM

## 2013-09-13 DIAGNOSIS — I10 Essential (primary) hypertension: Secondary | ICD-10-CM | POA: Diagnosis present

## 2013-09-13 DIAGNOSIS — F29 Unspecified psychosis not due to a substance or known physiological condition: Secondary | ICD-10-CM | POA: Diagnosis present

## 2013-09-13 DIAGNOSIS — Z87820 Personal history of traumatic brain injury: Secondary | ICD-10-CM

## 2013-09-13 DIAGNOSIS — R45850 Homicidal ideations: Secondary | ICD-10-CM

## 2013-09-13 DIAGNOSIS — F419 Anxiety disorder, unspecified: Secondary | ICD-10-CM

## 2013-09-13 DIAGNOSIS — F17200 Nicotine dependence, unspecified, uncomplicated: Secondary | ICD-10-CM | POA: Diagnosis present

## 2013-09-13 DIAGNOSIS — F1210 Cannabis abuse, uncomplicated: Secondary | ICD-10-CM | POA: Diagnosis present

## 2013-09-13 DIAGNOSIS — F4310 Post-traumatic stress disorder, unspecified: Principal | ICD-10-CM | POA: Diagnosis present

## 2013-09-13 DIAGNOSIS — F209 Schizophrenia, unspecified: Secondary | ICD-10-CM | POA: Diagnosis present

## 2013-09-13 DIAGNOSIS — F1010 Alcohol abuse, uncomplicated: Secondary | ICD-10-CM

## 2013-09-13 HISTORY — DX: Essential (primary) hypertension: I10

## 2013-09-13 MED ORDER — ARIPIPRAZOLE 15 MG PO TABS
15.0000 mg | ORAL_TABLET | Freq: Every day | ORAL | Status: DC
  Administered 2013-09-14: 15 mg via ORAL
  Filled 2013-09-13 (×2): qty 1

## 2013-09-13 MED ORDER — PRAZOSIN HCL 2 MG PO CAPS
2.0000 mg | ORAL_CAPSULE | Freq: Every day | ORAL | Status: DC
  Filled 2013-09-13: qty 1
  Filled 2013-09-13: qty 2

## 2013-09-13 MED ORDER — MAGNESIUM HYDROXIDE 400 MG/5ML PO SUSP: 30.0000 mL | Freq: Every day | ORAL | Status: DC | PRN

## 2013-09-13 MED ORDER — CHLORDIAZEPOXIDE HCL 25 MG PO CAPS
25.0000 mg | ORAL_CAPSULE | Freq: Three times a day (TID) | ORAL | Status: DC | PRN
  Administered 2013-09-15: 25 mg via ORAL
  Filled 2013-09-13: qty 1

## 2013-09-13 MED ORDER — TRAZODONE HCL 50 MG PO TABS
50.0000 mg | ORAL_TABLET | Freq: Every evening | ORAL | Status: DC | PRN
  Administered 2013-09-13 – 2013-09-16 (×6): 50 mg via ORAL
  Filled 2013-09-13 (×6): qty 1

## 2013-09-13 MED ORDER — ALUM & MAG HYDROXIDE-SIMETH 200-200-20 MG/5ML PO SUSP: 30.0000 mL | ORAL | Status: DC | PRN

## 2013-09-13 MED ORDER — SERTRALINE HCL 100 MG PO TABS
100.0000 mg | ORAL_TABLET | Freq: Every day | ORAL | Status: DC
  Administered 2013-09-14: 100 mg via ORAL
  Filled 2013-09-13 (×2): qty 1

## 2013-09-13 MED ORDER — CLONIDINE HCL 0.2 MG PO TABS
0.2000 mg | ORAL_TABLET | Freq: Two times a day (BID) | ORAL | Status: DC
  Administered 2013-09-13 – 2013-09-14 (×2): 0.2 mg via ORAL
  Filled 2013-09-13: qty 2
  Filled 2013-09-13 (×4): qty 1

## 2013-09-13 MED ORDER — HYDROCHLOROTHIAZIDE 12.5 MG PO CAPS
25.0000 mg | ORAL_CAPSULE | Freq: Every day | ORAL | Status: DC
  Filled 2013-09-13: qty 2

## 2013-09-13 MED ORDER — CLONIDINE HCL 0.1 MG PO TABS
0.1000 mg | ORAL_TABLET | Freq: Once | ORAL | Status: AC
  Administered 2013-09-13: 0.1 mg via ORAL
  Filled 2013-09-13 (×2): qty 1

## 2013-09-13 NOTE — ED Notes (Signed)
Report called [facility] Trula [facility]rehristina[facility]  BH ready for pt.

## 2013-09-13 NOTE — Progress Notes (Signed)
[facility]. [facility] to [facility] [employer]. Pt support paperwork to be completed with RN.   Byrd HesselbachKristen Estuardo Frisbee, LCSW [phone]  ED CSW 09/13/2013 1315pm

## 2013-09-13 NOTE — Progress Notes (Signed)
   CARE MANAGEMENT ED NOTE 09/13/2013  Patient:  Calvin Erickson,Calvin Erickson   Account Number:  [id number]  Date Initiated:  09/13/2013  Documentation initiated by:  Edd ArbourGIBBS,KIMBERLY  Subjective/Objective Assessment:   53 yr old medicare [place] pt dx  Alcohol Abuse, Anxiety Disorder NOS, Chronic Paranoid Schizophrenia and Post Traumatic Stress Disorder     Subjective/Objective Assessment Detail:   [facility] Pt confirms medicare coverage without pcp     Action/Plan:   Cm spoke with pt and offered a 13 page list of medicare providers within zip code [phone]   Action/Plan Detail:   Anticipated DC Date:  09/13/2013     Status Recommendation to Physician:   Result of Recommendation:    Other ED Services  Consult Working Plan    DC Planning Services  Other  PCP issues  Outpatient Services - Pt will follow up    Choice offered to / List presented to:            Status of service:  Completed, signed off  ED Comments:   ED Comments Detail:

## 2013-09-13 NOTE — Tx Team (Signed)
Initial Interdisciplinary Treatment Plan  PATIENT STRENGTHS: (choose at least two) Ability for insight Capable of independent living General fund of knowledge Motivation for treatment/growth  PATIENT STRESSORS: Financial difficulties Medication change or noncompliance Traumatic event   PROBLEM LIST: Problem List/Patient Goals Date to be addressed Date deferred Reason deferred Estimated date of resolution  Coping skills      Med change      AVH      Lack of support                                     DISCHARGE CRITERIA:  Ability to meet basic life and health needs Adequate post-discharge living arrangements Improved stabilization in mood, thinking, and/or behavior  PRELIMINARY DISCHARGE PLAN: Attend aftercare/continuing care group Attend PHP/IOP Return to previous living arrangement  PATIENT/FAMIILY INVOLVEMENT: This treatment plan has been presented to and reviewed with the patient, Calvin Erickson.  The patient and family have been given the opportunity to ask questions and make suggestions.  Calvin Erickson, Calvin Erickson 09/13/2013, 6:26 PM
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
## 2013-09-13 NOTE — ED Notes (Signed)
Support paperwork faxed to [facility].  
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
## 2013-09-13 NOTE — Consult Note (Signed)
  Review of Systems  Constitutional: Negative.   HENT: Negative.   Eyes: Negative.   Respiratory: Negative.   Cardiovascular: Negative.   Gastrointestinal: Negative.   Genitourinary: Negative.   Musculoskeletal: Negative.   Skin: Negative.   Neurological: Negative.   Endo/Heme/Allergies: Negative.   Psychiatric/Behavioral: Positive for hallucinations.   No medical or physical issues he says
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
## 2013-09-14 DIAGNOSIS — R45850 Homicidal ideations: Secondary | ICD-10-CM

## 2013-09-14 MED ORDER — PRAZOSIN HCL 2 MG PO CAPS
2.0000 mg | ORAL_CAPSULE | Freq: Every day | ORAL | Status: DC
  Administered 2013-09-14: 2 mg via ORAL
  Filled 2013-09-14 (×2): qty 1

## 2013-09-14 MED ORDER — CITALOPRAM HYDROBROMIDE 20 MG PO TABS
20.0000 mg | ORAL_TABLET | Freq: Every day | ORAL | Status: DC
  Administered 2013-09-15 – 2013-09-20 (×6): 20 mg via ORAL
  Filled 2013-09-14 (×8): qty 1
  Filled 2013-09-14: qty 3
  Filled 2013-09-14: qty 1

## 2013-09-14 MED ORDER — BENZTROPINE MESYLATE 0.5 MG PO TABS
0.5000 mg | ORAL_TABLET | Freq: Every day | ORAL | Status: DC
  Administered 2013-09-15 – 2013-09-19 (×5): 0.5 mg via ORAL
  Filled 2013-09-14 (×6): qty 1

## 2013-09-14 MED ORDER — ADULT MULTIVITAMIN W/MINERALS CH
1.0000 | ORAL_TABLET | Freq: Every day | ORAL | Status: DC
  Administered 2013-09-14 – 2013-09-20 (×7): 1 via ORAL
  Filled 2013-09-14 (×3): qty 1
  Filled 2013-09-14: qty 3
  Filled 2013-09-14 (×5): qty 1

## 2013-09-14 MED ORDER — HYDROCHLOROTHIAZIDE 25 MG PO TABS
25.0000 mg | ORAL_TABLET | Freq: Every day | ORAL | Status: DC
  Administered 2013-09-14: 25 mg via ORAL
  Filled 2013-09-14 (×2): qty 1

## 2013-09-14 MED ORDER — ENSURE COMPLETE PO LIQD
237.0000 mL | Freq: Two times a day (BID) | ORAL | Status: DC
  Administered 2013-09-14 – 2013-09-20 (×11): 237 mL via ORAL

## 2013-09-14 MED ORDER — ATENOLOL 25 MG PO TABS
25.0000 mg | ORAL_TABLET | Freq: Every day | ORAL | Status: DC
  Administered 2013-09-14 – 2013-09-19 (×6): 25 mg via ORAL
  Filled 2013-09-14 (×8): qty 1

## 2013-09-14 MED ORDER — PALIPERIDONE ER 6 MG PO TB24
6.0000 mg | ORAL_TABLET | Freq: Every day | ORAL | Status: DC
  Administered 2013-09-15 – 2013-09-19 (×5): 6 mg via ORAL
  Filled 2013-09-14 (×6): qty 1

## 2013-09-14 NOTE — Progress Notes (Signed)
Adult Psychoeducational Group Note  Date:  09/14/2013 Time:  10:09 PM  Group Topic/Focus:  Wrap-Up Group:   The focus of this group is to help patients review their daily goal of treatment and discuss progress on daily workbooks.  Participation Level:  Active  Participation Quality:  Appropriate  Affect:  Appropriate  Cognitive:  Alert  Insight: Appropriate  Engagement in Group:  Engaged  Modes of Intervention:  Discussion  Additional Comments:  Pt stated that he had a decent day. He had a good conversation with the Dr. And that made him feel good. He is really focused on getting help because he know that drinking is not the way to go.  Calvin Erickson, [name] R 09/14/2013, 10:09 PM
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
## 2013-09-14 NOTE — Progress Notes (Signed)
NUTRITION ASSESSMENT  Pt identified as at risk on the Malnutrition Screen Tool  INTERVENTION: 1. Educated patient on the importance of nutrition and encouraged intake of food and beverages. 2. Discussed weight goals. 3. Supplements: will order MVI daily.  NUTRITION DIAGNOSIS: Unintentional weight loss related to sub-optimal intake as evidenced by pt report.   Goal: Pt to meet >/= 90% of their estimated nutrition needs.  Monitor:  PO intake  Assessment:  Patient admitted with etoh abuse, anxiety disorder NOS, chronic paranoid schizophrenia and PTSD.  Per nurse patient is eating well.    53 y.o. male  Height: Ht Readings from Last 1 Encounters:  09/13/13 5' 4.5" (1.638 m)    Weight: Wt Readings from Last 1 Encounters:  09/13/13 154 lb (69.854 kg)    Weight Hx: Wt Readings from Last 10 Encounters:  09/13/13 154 lb (69.854 kg)    BMI:  Body mass index is 26.04 kg/(m^2). Pt meets criteria for overweight based on current BMI.    Estimated Nutritional Needs: Kcal: 25-30 kcal/kg Protein: > 1 gram protein/kg Fluid: 1 ml/kcal  Diet Order: General Pt is also offered choice of unit snacks mid-morning and mid-afternoon.  Pt is eating as desired.   Lab results and medications reviewed.   Oran ReinLaura Paz Fuentes, RD, LDN Clinical Inpatient Dietitian Pager:  [phone] Weekend and after hours pager:  [phone]

## 2013-09-14 NOTE — [facility] Suicide Risk Assessment (Signed)
[facility] INPATIENT:  Family/Significant Other Suicide Prevention Education  Suicide Prevention Education:  Education Completed; Calvin Erickson, Calvin Erickson [phone] has been identified by the patient as the family member/significant other with whom the patient will be residing, and identified as the person(s) who will aid the patient in the event of a mental health crisis (suicidal ideations/suicide attempt).  With written consent from the patient, the family member/significant other has been provided the following suicide prevention education, prior to the and/or following the discharge of the patient.  The suicide prevention education provided includes the following:  Suicide risk factors  Suicide prevention and interventions  National Suicide Hotline telephone number  [facility] telephone number  [facility] 911  [facility] and/or Residential Mobile Crisis Unit telephone number  Request made of family/significant other to:  Remove weapons (e.g., guns, rifles, knives), all items previously/currently identified as safety concern.    Remove drugs/medications (over-the-counter, prescriptions, illicit drugs), all items previously/currently identified as a safety concern.  The family member/significant other verbalizes understanding of the suicide prevention education information provided.  The family member/significant other agrees to remove the items of safety concern listed above.  Calvin Erickson, Calvin Erickson 09/14/2013, 2:58 PM

## 2013-09-14 NOTE — [facility] Suicide Risk Assessment (Signed)
[facility] INPATIENT:  Family/Significant Other Suicide Prevention Education  Suicide Prevention Education:  Contact Attempts: Hulan FrayFriend, Marshana Caplo  [phone] has been identified by the patient as the family member/significant other with whom the patient will be residing, and identified as the person(s) who will aid the patient in the event of a mental health crisis.  With written consent from the patient, two attempts were made to provide suicide prevention education, prior to and/or following the patient's discharge.  We were unsuccessful in providing suicide prevention education.  A suicide education pamphlet was given to the patient to share with family/significant other.  Date and time of first attempt: 09/14/13 @ 11am Date and time of second attempt:  09/14/13 @ 2:30pm   Simona HuhYang, Karl Knarr 09/14/2013, 2:36 PM

## 2013-09-14 NOTE — [facility] Group Notes (Signed)
[facility]  09/14/2013  1:25 PM  Type of Therapy:  [facility] [employer] Presentation   Participation Level:  Minimal  Participation Quality:  Inattentive  Affect:  Anxious  Cognitive:  Disorganized  Insight:  Lacking  Engagement in Therapy:  Lacking  Modes of Intervention:  Discussion, Education and Socialization   Summary of Progress/Problems:  [name] from [employer] came to present his recovery story and play the guitar.  [name] nodded when the speaker told the group about his experiences with questioning if the voices were real or not.  [name] kept on opening and closing his eyes.  Appeared to be RIS.  He left group in the last 20 minutes and did not return.    Calvin Erickson   09/14/2013  1:25 PM
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
## 2013-09-14 NOTE — [facility] Group Notes (Signed)
Adult Psychoeducational Group Note  Date:  09/14/2013 Time:  1:49 PM  Group Topic/Focus:  Managing Feelings:   The focus of this group is to identify what feelings patients have difficulty handling and develop a plan to handle them in a healthier way upon discharge.  Participation Level:  Active  Participation Quality:  Attentive, Redirectable and Sharing  Affect:  Appropriate  Cognitive:  Alert  Insight: Good  Engagement in Group:  Engaged and Off Topic  Modes of Intervention:  Discussion, Education and Support  Additional Comments:  Pt was actively engaged in group but was getting off topic at times and had to be redirected.  Hilaria Titsworth P 09/14/2013, 1:49 PM
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
## 2013-09-14 NOTE — [facility] Suicide Risk Assessment (Signed)
   Nursing information obtained from:    Demographic factors:    Current Mental Status:    Loss Factors:    Historical Factors:    Risk Reduction Factors:    Total Time spent with patient: 20 minutes  CLINICAL FACTORS:   Severe Anxiety and/or Agitation Alcohol/Substance Abuse/Dependencies Schizophrenia:   Command hallucinatons Paranoid or undifferentiated type Currently Psychotic Previous Psychiatric Diagnoses and Treatments  Psychiatric Specialty Exam: Physical Exam  Psychiatric: His speech is normal. His mood appears anxious. He is actively hallucinating. Thought content is paranoid and delusional. Cognition and memory are normal. He expresses impulsivity.    Review of Systems  Constitutional: Negative.   Eyes: Negative.   Respiratory: Negative.   Cardiovascular: Negative.   Gastrointestinal: Negative.   Genitourinary: Negative.   Musculoskeletal: Negative.   Skin: Negative.   Neurological: Negative.   Endo/Heme/Allergies: Negative.   Psychiatric/Behavioral: Positive for suicidal ideas, hallucinations and substance abuse. The patient is nervous/anxious and has insomnia.     Blood pressure 126/93, pulse 84, temperature 98.3 F (36.8 C), temperature source Oral, resp. rate 18, height 5' 4.5" (1.638 m), weight 69.854 kg (154 lb).Body mass index is 26.04 kg/(m^2).  General Appearance: Fairly Groomed  Patent attorneyye Contact::  Good  Speech:  Clear and Coherent  Volume:  Normal  Mood:  Anxious  Affect:  Full Range  Thought Process:  Circumstantial  Orientation:  Full (Time, Place, and Person)  Thought Content:  Hallucinations: Auditory and Paranoid Ideation  Suicidal Thoughts:  Yes.  without intent/plan  Homicidal Thoughts:  No  Memory:  Immediate;   Fair Recent;   Fair Remote;   Fair  Judgement:  Impaired  Insight:  Shallow  Psychomotor Activity:  Decreased  Concentration:  Fair  Recall:  FiservFair  Fund of Knowledge:Fair  Language: Good  Akathisia:  No  Handed:  Right  AIMS  (if indicated):     Assets:  Communication Skills Desire for Improvement Physical Health  Sleep:  Number of Hours: 6   Musculoskeletal: Strength & Muscle Tone: within normal limits Gait & Station: normal Patient leans: Right  COGNITIVE FEATURES THAT CONTRIBUTE TO RISK:  Closed-mindedness Polarized thinking    SUICIDE RISK:   Minimal: No identifiable suicidal ideation.  Patients presenting with no risk factors but with morbid ruminations; may be classified as minimal risk based on the severity of the depressive symptoms  PLAN OF CARE:1. Admit for crisis management and stabilization. 2. Medication management to reduce current symptoms to base line and improve the     patient's overall level of functioning 3. Treat health problems as indicated. 4. Develop treatment plan to decrease risk of relapse upon discharge and the need for     readmission. 5. Psycho-social education regarding relapse prevention and self care. 6. Health care follow up as needed for medical problems. 7. Restart home medications where appropriate.   I certify that inpatient services furnished can reasonably be expected to improve the patient's condition.  Thedore MinsAkintayo, Toia Micale, MD 09/14/2013, 11:31 AM

## 2013-09-15 DIAGNOSIS — F4310 Post-traumatic stress disorder, unspecified: Principal | ICD-10-CM

## 2013-09-15 DIAGNOSIS — F209 Schizophrenia, unspecified: Secondary | ICD-10-CM

## 2013-09-15 DIAGNOSIS — R45851 Suicidal ideations: Secondary | ICD-10-CM

## 2013-09-15 MED ORDER — PRAZOSIN HCL 2 MG PO CAPS
4.0000 mg | ORAL_CAPSULE | Freq: Every day | ORAL | Status: DC
  Administered 2013-09-15 – 2013-09-19 (×5): 4 mg via ORAL
  Filled 2013-09-15: qty 6
  Filled 2013-09-15 (×6): qty 2

## 2013-09-15 NOTE — Progress Notes (Signed)
Patient ID: Calvin Erickson, male   DOB: Mar 27, 1961, 53 y.o.   MRN: [id number] D: Patient in room on approach. Pt stated his day was ok and wanted to sleep. Pt reports he did not sleep well last night and wanted a different medication to help sleep. Pt mood and affect appeared depressed depressed and flat. Pt denies SI/HI/AVH and pain. Pt attended evening wrap up group and Interacted appropriately with peers. Pt denies any needs or concerns.  Cooperative with assessment. No acute distressed noted at this time.   A: Met with pt 1:1. Medications administered as prescribed. Writer encouraged pt to discuss feelings. Pt encouraged to come to staff with any question or concerns.   R: Patient remains safe. He is complaint with medications and denies any adverse reaction. Continue current POC.

## 2013-09-15 NOTE — [facility] Group Notes (Signed)
Adult Psychoeducational Group Note  Date:  09/15/2013 Time:  0900 am  Group Topic/Focus:  Orientation:   The focus of this group is to educate the patient on the purpose and policies of crisis stabilization and provide a format to answer questions about their admission.  The group details unit policies and expectations of patients while admitted.  Participation Level:  Active  Participation Quality:  Attentive, Monopolizing, Redirectable and Sharing  Affect:  Flat  Cognitive:  Alert and Appropriate  Insight: Good  Engagement in Group:  Engaged, Improving, Off Topic and Supportive  Modes of Intervention:  Clarification, Discussion, Education, Orientation and Support  Additional Comments:  Pt was actively sharing his reason for admission during group, got off topic but was redirectable and was showing support to other patients by talking to them about the importance of staying on medications after discharge.    [name], Timber Lucarelli Brooke 09/15/2013, 1:09 PM

## 2013-09-15 NOTE — Progress Notes (Signed)
D:  Per pt self inventory pt reports sleeping poor, appetite poor, energy level low, ability to pay attention improving, rates depression at a 5 out of 10 and hopelessness at a 5 out of 10, pt c/o Passive SI on and off, contracts for safety, denies HI, endorses AH, "hearing a drumming noise and people call my name.", pt c/o insomnia, cooperative, flat during interaction, c/o anxiety on and off throughout the day.    A:  Emotional support provided, Encouraged pt to continue with treatment plan and attend all group activities, q15 min checks maintained for safety.  R:  Pt is receptive, going to group, said that he has realized that he may need to take medication for the rest of his life but states that when he is not here he start to "slide off" by picking up drinking again and then it progresses to where he stops taking his meds, pt states that he does not have good support at home to encourage him to stay on his meds states that his family/friends "think my mental illness is a joke and don't see the importance of me taking my medication."  states that it is hard to stay on track without support.  Sates that he has intrusive thoughts and racing thoughts frequently and that his anxiety level can escalate quickly without warning, encouraged pt to try and think about and identify some triggers for his anxiety.

## 2013-09-15 NOTE — [facility] Group Notes (Signed)
[facility] Group Notes:  (Counselor/Nursing/MHT/Case Management/Adjunct)  09/15/2013 1:15PM  Type of Therapy:  Group Therapy  Participation Level:  Active  Participation Quality:  Appropriate  Affect:  Flat  Cognitive:  Oriented  Insight:  Improving  Engagement in Group:  Limited  Engagement in Therapy:  Limited  Modes of Intervention:  Discussion, Exploration and Socialization  Summary of Progress/Problems: The topic for group was balance in life.  Pt participated in the discussion about when their life was in balance and out of balance and how this feels.  Pt discussed ways to get back in balance and short term goals they can work on to get where they want to be. "I have been aggressive with others, when I would much rather be assertive."   As he spoke, it became clear that he is more assertive than aggressive, but his insights were good.  He used the group to think about a friend who is not a good influence and try to figure out options of how to deal with him.  He admitted towards the end of group that the NP's idea of getting an injection was a good idea as he tends to get away from the habit of taking meds PO regularly.   Daryel Geraldorth, Jaleil Renwick B 09/15/2013 1:40 PM

## 2013-09-15 NOTE — Progress Notes (Signed)
Patient ID: Calvin Erickson, male   DOB: Feb 01, 1961, 53 y.o.   MRN: [id number] D: Patient in dayroom on approach. Pt stated he had an ok but it could be better". Pt reports hearing voices earlier today but not tonight. Pt reports feeling increased anxiety but does not know why. Pt mood and affect appeared depressed and flat. Pt denies SI/HI/AVH and pain. Pt did not attend evening karaoke. Pt is observed on unit interacting well with peers. Pt denies any needs or concerns.  Cooperative with assessment. No acute distressed noted at this time.   A: Met with pt 1:1. Medications administered as prescribed. Writer encouraged pt to discuss feelings. Pt encouraged to come to staff with any question or concerns.   R: Patient remains safe. He is complaint with medications and denies any adverse reaction. Continue current POC.
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
## 2013-09-16 NOTE — [facility] Group Notes (Signed)
[facility] LCSW Group Therapy  09/16/2013 12:49 PM  Type of Therapy:  Group Therapy   Participation Level:  Active  Participation Quality:  Appropriate  Affect:  Appropriate  Cognitive:  Appropriate  Insight:  Engaged  Engagement in Therapy:  Engaged  Modes of Intervention:  Discussion and Socialization   Summary of Progress/Problems:  Chaplain was here to lead a group on themes of hope and courage. Marked agreed that 99% of the times he believes that everything is his fault.  He introduced a phrase "you can't catch gone" to describe when he cannot control the past.  He stated that the bad things he has done usually are directed towards others.  Then, if they don't forgive him, he struggles,  "It feels like pulling a thorn, but it's hard."  He shared that honesty usually causes trouble and natural consequences, but has released him from being in a dark place.  As a response to the question about having 6 more hours in the day, he stated that he would have to work  to occupy himself with activities.  [name]'s hope is to become a better person.    Calvin Erickson   09/16/2013  12:49 PM

## 2013-09-16 NOTE — Progress Notes (Signed)
D. Pt has been up and has been visible in milieu this evening, attending and participating in various milieu activities. Pt has endorsed depression today however he reports that he is starting to feel better and spoke about realizing that he needs to stay on his medications when he gets discharged from here. Pt has some passive SI but able to contract for safety on the unit. Pt received all medications without incident and did not verbalize any complaints of pain. A. Support and encouragement provided, medication education given. R. Pt verbalized understanding, safety maintained.

## 2013-09-16 NOTE — Progress Notes (Signed)
Patient ID: Calvin Erickson, male   DOB: 12-28-1960, 53 y.o.   MRN: [id number] [facility]  09/16/2013 11:04 AM Calvin SpryMark L Erickson  MRN:  [id number] Subjective:   Patient states "I slept better last night but I am still hearing voices.'' Objective: Patient seen and chart is reviewed. He is endorsing decreased nightmares, anxiety, psychosis and depressive symptoms. He continues to verbalized paranoia and hearing voices telling him to hurt himself. He denies homicidal ideation, intent or plan. He states that he like his current medication regimen and would like to stay on it. He denies any adverse reactions to his medication and has been attending the unit milieu. Diagnosis:   DSM5:  Schizophrenia Disorders: Schizophrenia (295.7)  Obsessive-Compulsive Disorders:  Trauma-Stressor Disorders:  Substance/Addictive Disorders:  Depressive Disorders:  AXIS I: Schizophrenia               Post traumatic stress disorder AXIS II: Deferred  AXIS III:  Past Medical History   Diagnosis  Date   .  Hypertension     AXIS IV: occupational problems, other psychosocial or environmental problems and problems related to social environment  AXIS V: 31-40 impairment in reality testing  ADL's:  Intact  Sleep: fair  Appetite:  Fair  Suicidal Ideation:  Passive SI with no plan Homicidal Ideation:  Denies AEB (as evidenced by):  Psychiatric Specialty Exam: Physical Exam  Review of Systems  Constitutional: Negative.   HENT: Negative.   Eyes: Negative.   Respiratory: Negative.   Cardiovascular: Negative.   Gastrointestinal: Negative.   Genitourinary: Negative.   Musculoskeletal: Negative.   Skin: Negative.   Neurological: Negative.   Endo/Heme/Allergies: Negative.   Psychiatric/Behavioral: Positive for depression, suicidal ideas, hallucinations, memory loss and substance abuse. The patient is nervous/anxious and has insomnia.     Blood pressure 113/75, pulse 98, temperature 98.2 F (36.8 C),  temperature source Oral, resp. rate 20, height 5' 4.5" (1.638 m), weight 69.854 kg (154 lb).Body mass index is 26.04 kg/(m^2).  General Appearance: fairly groomed  Patent attorneyye Contact::  Good  Speech:  Clear and Coherent  Volume:  Normal  Mood:  Anxious  Affect:  Full Range  Thought Process:  Goal Directed and Intact  Orientation:  Full (Time, Place, and Person)  Thought Content:  Hallucinations: Auditory  Suicidal Thoughts:  Yes.  without intent/plan  Homicidal Thoughts:  No  Memory:  Immediate;   Fair Recent;   Fair Remote;   Fair  Judgement:  Fair  Insight:  Present  Psychomotor Activity:  Normal  Concentration:  Fair  Recall:  FiservFair  Fund of Knowledge:Fair  Language: Good  Akathisia:  No  Handed:  Right  AIMS (if indicated):     Assets:  Communication Skills Desire for Improvement Physical Health  Sleep:  Number of Hours: 6.5   Musculoskeletal: Strength & Muscle Tone: within normal limits Gait & Station: normal Patient leans: Right  Current Medications: Current Facility-Administered Medications  Medication Dose Route Frequency Provider Last Rate Last Dose  . alum & mag hydroxide-simeth (MAALOX/MYLANTA) 200-200-20 MG/5ML suspension 30 mL  30 mL Oral Q4H PRN Shuvon Rankin, NP      . atenolol (TENORMIN) tablet 25 mg  25 mg Oral Daily Yaroslav Gombos   25 mg at 09/16/13 0816  . benztropine (COGENTIN) tablet 0.5 mg  0.5 mg Oral Daily Kellon Chalk   0.5 mg at 09/16/13 0816  . chlordiazePOXIDE (LIBRIUM) capsule 25 mg  25 mg Oral TID PRN Assunta FoundShuvon Rankin, NP  25 mg at 09/15/13 1441  . citalopram (CELEXA) tablet 20 mg  20 mg Oral Daily Conda Wannamaker   20 mg at 09/16/13 0816  . feeding supplement (ENSURE COMPLETE) (ENSURE COMPLETE) liquid 237 mL  237 mL Oral BID BM Jacquelyn Antony   237 mL at 09/16/13 0819  . magnesium hydroxide (MILK OF MAGNESIA) suspension 30 mL  30 mL Oral Daily PRN Shuvon Rankin, NP      . multivitamin with minerals tablet 1 tablet  1 tablet Oral Daily Jeoffrey Massed, RD   1 tablet at 09/16/13 [phone]  . paliperidone (INVEGA) 24 hr tablet 6 mg  6 mg Oral Daily Yamilette Garretson   6 mg at 09/16/13 0816  . prazosin (MINIPRESS) capsule 4 mg  4 mg Oral QHS Fransisca Kaufmann, NP   4 mg at 09/15/13 2230  . traZODone (DESYREL) tablet 50 mg  50 mg Oral QHS PRN,MR X 1 Evanna Cori Merry Proud, NP   50 mg at 09/15/13 2303    Lab Results: No results found for this or any previous visit (from the past 48 hour(s)).  Physical Findings: AIMS:  , ,  ,  ,    CIWA:    COWS:     Treatment Plan Summary: Daily contact with patient to assess and evaluate symptoms and progress in treatment Medication management  Plan: 1. Continue crisis management and stabilization.  2. Medication management: Reviewed with patient who stated no untoward effects.  Continue Invega 6 mg daily for psychosis. Increase Minipress to 4 mg at hs for nightmares  Celexa 20 mg daily for depression/PTSD symptoms  Cogentin 0.5 mg daily for EPS prevention 3. Encouraged patient to attend groups and participate in group counseling sessions and activities.  4. Discharge plan in progress.  5. Continue current treatment plan.  6. Address health issues: Vitals reviewed and stable.   Medical Decision Making Problem Points:  Established problem, worsening (2), Review of last therapy session (1) and Review of psycho-social stressors (1) Data Points:  Order Aims Assessment (2) Review of medication regiment & side effects (2) Review of new medications or change in dosage (2)  I certify that inpatient services furnished can reasonably be expected to improve the patient's condition.   Thedore Mins, MD 09/16/2013, 11:04 AM

## 2013-09-16 NOTE — Progress Notes (Signed)
Patient ID: Calvin Erickson, male   DOB: [birth year], [age] y.o.   MRN: [id number] 09-16-2013 no shift note written by the writer. Pass on in shift report to brook m,

## 2013-09-16 NOTE — [facility] Group Notes (Signed)
[facility]   09/16/2013 9:32 AM  Participation Quality:  Engaged  Mood/Affect:  Appropriate  Depression Rating:  5  Anxiety Rating:  4  Thoughts of Suicide:  No Will you contract for safety?   NA  Current AVH:  No  Plan for Discharge/Comments:  Calvin Erickson is feeling better.  He is optimistic about his meds, and is interested in an injection "because I forget to take pills, and it causes problems."    Transportation Means: family  Supports: family,friends  [place], LochsloyRodney B

## 2013-09-16 NOTE — Care Management Utilization Note (Signed)
   Per State Regulation 482.30  This chart was reviewed for necessity with respect to the patient's Admission/ Duration of stay.  Next review date: 09/19/13  Tyden Kann Morrison RN, BSN 

## 2013-09-17 MED ORDER — TRAZODONE HCL 100 MG PO TABS
100.0000 mg | ORAL_TABLET | Freq: Every evening | ORAL | Status: DC | PRN
  Administered 2013-09-17 – 2013-09-19 (×5): 100 mg via ORAL
  Filled 2013-09-17: qty 3
  Filled 2013-09-17 (×5): qty 1

## 2013-09-17 MED ORDER — PALIPERIDONE ER 3 MG PO TB24
3.0000 mg | ORAL_TABLET | Freq: Every day | ORAL | Status: DC
  Administered 2013-09-17 – 2013-09-19 (×3): 3 mg via ORAL
  Filled 2013-09-17 (×5): qty 1

## 2013-09-17 NOTE — Progress Notes (Signed)
Patient ID: Melanee SpryMark L Radigan, male   DOB: Oct 05, 1960, 53 y.o.   MRN: [id number] Psychoeducational Group Note  Date:  09/17/2013 Time:0930am  Group Topic/Focus:  Identifying Needs:   The focus of this group is to help patients identify their personal needs that have been historically problematic and identify healthy behaviors to address their needs.  Participation Level:  Active  Participation Quality:  Appropriate  Affect:  Appropriate  Cognitive:  Appropriate  Insight:  Supportive  Engagement in Group:  Supportive  Additional Comments:  Healthy coping skills.   Valente DavidWeaver, Argel Pablo Brooks 09/17/2013,10:52 AM

## 2013-09-17 NOTE — Progress Notes (Signed)
D: Pt states that he has had a good day. When talking about his discharge plan, pt stated that he wants to stay on his medications this time because he has a tendency to stop taking his medications once he starts getting better. He denies SI/HI but endorses some auditory hallucinations.  A: Support given. Verbalization encouraged. Pt encouraged to come to staff with any concerns. R: Pt is receptive. No complaints of pain or discomfort at this time. Will continue to monitor pt. Pt remains safe on the unit. Q15 min safety checks maintained.

## 2013-09-17 NOTE — Progress Notes (Signed)
Patient ID: Melanee SpryMark L Lindahl, male   DOB: May 26, 1961, 53 y.o.   MRN: [id number] Psychoeducational Group Note  Date:  09/17/2013 Time:0910am  Group Topic/Focus:  Identifying Needs:   The focus of this group is to help patients identify their personal needs that have been historically problematic and identify healthy behaviors to address their needs.  Participation Level:  Active  Participation Quality:  Appropriate  Affect:  Appropriate  Cognitive:  Appropriate  Insight:  Supportive  Engagement in Group:  Supportive  Additional Comments:  Self inventory   Valente DavidWeaver, Baelyn Doring Brooks 09/17/2013,10:50 AM

## 2013-09-17 NOTE — Progress Notes (Signed)
[facility] Group Notes:  (Nursing/MHT/Case Management/Adjunct)  Date:  09/17/2013  Time:  8:00 p.m.   Type of Therapy:  Psychoeducational Skills  Participation Level:  Active  Participation Quality:  Appropriate  Affect:  Appropriate  Cognitive:  Appropriate  Insight:  Improving  Engagement in Group:  Engaged  Modes of Intervention:  Education  Summary of Progress/Problems: The patient shared in group that he had a good day. For one, he indicated that he was able to sleep well last evening. Secondly, he mentioned that his medications are beginning to work. Finally, he mentioned that he has been working on trying to stay on his medications.   Hazle CocaGOODMAN, Adriano Bischof S 09/17/2013, 9:24 PM

## 2013-09-17 NOTE — [facility] Group Notes (Signed)
[facility] LCSW Group Therapy  09/17/2013 11:15 AM  Type of Therapy:  Group Therapy  Participation Level:  Active  Participation Quality:  Appropriate, Attentive and Sharing  Affect:  Appropriate  Cognitive:  Alert, Appropriate and Oriented  Insight:  Engaged  Engagement in Therapy:  Engaged  Modes of Intervention:  Clarification, Discussion, Exploration, Rapport Building, Socialization and Support  Summary of Progress/Problems: The main focus of today's process group was for the patient to identify ways in which they have in the past sabotaged their own recovery. Motivational Interviewing was utilized to ask the group members what they get out of their substance use, and what reasons they may have for wanting to change. The Stages of Change were explained using a handout, and patients identified where they currently are with regard to stages of change.  [name] shared that he feels he is in the Action stage "well at least I have gotten close to this before but never willing to take the action until now." Patient reports he is willing to stay on medications now more than ever before as "my noncompliance with medication always gets me in a tight spot," [name] was supportive and encouraging of others in group and shared about his daily rituals and willingness to include medication in daily rituals.  Clide DalesHarrill, Glennis Borger Campbell

## 2013-09-17 NOTE — Progress Notes (Signed)
Patient ID: Calvin SpryMark L Boney, male   DOB: [birth year], 53 y.o.   MRN: [id number] 09-17-13 @ 1325 nursing shift note: D: pt has been appropriate and cooperative today. He has come to the medication window for med's, gone to group and is interacting with his peers. he is not having any adverse effects from his medications.  A: he requested educational materials for his psychotropic medications they were printed and given to him. R: he was appreciative for the educational materials. On his inventory sheet he wrote. Slept well, appetite improving, energy low, attention improving with his depression and hopelessness both at 5. Suicidal thought "on and off" but he able to contract for safety.after discharge he plans to take his med's and go to his MD appts. After discharge he plans to stay on his med. RN will monitor and Q 15 min ck's continue.

## 2013-09-17 NOTE — Progress Notes (Signed)
Patient ID: Calvin Erickson Greenspan, male   DOB: Nov 28, 1960, [age] y.o.   MRN: [id number] [facility] MD Progress Note  09/17/2013 6:22 PM Calvin Erickson [place]  MRN:  [id number] Subjective:   "I've got PTSD for 13 year and was hallucinating thing that someone was trying to kill me."  Objective: Patient seen and chart is reviewed. The patient reports he has been doing better. He is endorsing further decreased nightmares, anxiety, psychosis and depressive symptoms. He continues to verbalized paranoia and hearing voices are decreasing to whisper but feels his sleep medications need to be increased. He denies homicidal ideation, intent or plan. He states that he like his current medication regimen and would like to stay on it. He denies any adverse reactions to his medication and has been attending the unit milieu. Diagnosis:   DSM5:  Schizophrenia Disorders: Schizophrenia (295.7)  Obsessive-Compulsive Disorders:  Trauma-Stressor Disorders:  Substance/Addictive Disorders:  Depressive Disorders:  AXIS I: Schizophrenia               Post traumatic stress disorder AXIS II: Deferred  AXIS III:  Past Medical History   Diagnosis  Date   .  Hypertension     AXIS IV: occupational problems, other psychosocial or environmental problems and problems related to social environment  AXIS V: 31-40 impairment in reality testing  ADL's:  Intact  Sleep: fair  Appetite:  Fair  Suicidal Ideation:  Passive SI with no plan Homicidal Ideation:  Denies AEB (as evidenced by):  Psychiatric Specialty Exam: Physical Exam  Constitutional: He appears well-developed and well-nourished. No distress.  Skin: He is not diaphoretic.    Review of Systems  Constitutional: Negative.  Negative for fever, chills and weight loss.  HENT: Negative.   Eyes: Positive for blurred vision (chronic.). Negative for double vision and photophobia.  Respiratory: Negative.  Negative for cough, hemoptysis, sputum production, shortness of breath and  wheezing.   Cardiovascular: Negative.  Negative for chest pain, palpitations and leg swelling.  Gastrointestinal: Negative.  Negative for heartburn, nausea, vomiting, abdominal pain, diarrhea and constipation.  Genitourinary: Negative.   Musculoskeletal: Negative.   Skin: Negative.  Negative for itching and rash.  Neurological: Negative.  Negative for dizziness, tingling, tremors, sensory change, speech change, focal weakness and seizures.  Endo/Heme/Allergies: Negative.   Psychiatric/Behavioral: Positive for depression, suicidal ideas, hallucinations, memory loss and substance abuse. The patient is nervous/anxious and has insomnia.     Blood pressure 135/84, pulse 76, temperature 99 F (37.2 C), temperature source Oral, resp. rate 16, height 5' 4.5" (1.638 m), weight 69.854 kg (154 lb).Body mass index is 26.04 kg/(m^2).  General Appearance: fairly groomed  Patent attorneyye Contact::  Good  Speech:  Clear and Coherent  Volume:  Normal  Mood:  Anxious  Affect:  Full Range  Thought Process:  Goal Directed and Intact  Orientation:  Full (Time, Place, and Person)  Thought Content:  Hallucinations: Auditory  Suicidal Thoughts:  Yes.  without intent/plan  Homicidal Thoughts:  No  Memory:  Immediate;   Fair Recent;   Fair Remote;   Fair  Judgement:  Fair  Insight:  Present  Psychomotor Activity:  Normal  Concentration:  Fair  Recall:  [employer]Fair  Fund of Knowledge:Fair  Language: Good  Akathisia:  No  Handed:  Right  AIMS (if indicated):     Assets:  Communication Skills Desire for Improvement Physical Health  Sleep:  Number of Hours: 6.5   Musculoskeletal: Strength & Muscle Tone: within normal limits Gait & Station: normal Patient  leans: Right  Current Medications: Current Facility-Administered Medications  Medication Dose Route Frequency Provider Last Rate Last Dose  . alum & mag hydroxide-simeth (MAALOX/MYLANTA) 200-200-20 MG/5ML suspension 30 mL  30 mL Oral Q4H PRN Shuvon Rankin, NP       . atenolol (TENORMIN) tablet 25 mg  25 mg Oral Daily Mojeed Akintayo   25 mg at 09/17/13 0746  . benztropine (COGENTIN) tablet 0.5 mg  0.5 mg Oral Daily Mojeed Akintayo   0.5 mg at 09/17/13 0746  . chlordiazePOXIDE (LIBRIUM) capsule 25 mg  25 mg Oral TID PRN Shuvon Rankin, NP   25 mg at 09/15/13 1441  . citalopram (CELEXA) tablet 20 mg  20 mg Oral Daily Mojeed Akintayo   20 mg at 09/17/13 0746  . feeding supplement (ENSURE COMPLETE) (ENSURE COMPLETE) liquid 237 mL  237 mL Oral BID BM Mojeed Akintayo   237 mL at 09/17/13 1515  . magnesium hydroxide (MILK OF MAGNESIA) suspension 30 mL  30 mL Oral Daily PRN Shuvon Rankin, NP      . multivitamin with minerals tablet 1 tablet  1 tablet Oral Daily Jeoffrey Massed, RD   1 tablet at 09/17/13 0746  . paliperidone (INVEGA) 24 hr tablet 6 mg  6 mg Oral Daily Mojeed Akintayo   6 mg at 09/17/13 0746  . prazosin (MINIPRESS) capsule 4 mg  4 mg Oral QHS Fransisca Kaufmann, NP   4 mg at 09/16/13 2137  . traZODone (DESYREL) tablet 50 mg  50 mg Oral QHS PRN,MR X 1 Evanna Cori Merry Proud, NP   50 mg at 09/16/13 2247    Lab Results: No results found for this or any previous visit (from the past 48 hour(s)).  Physical Findings: AIMS:  , ,  ,  ,    CIWA:    COWS:     Treatment Plan Summary: Daily contact with patient to assess and evaluate symptoms and progress in treatment Medication management  Plan: 1. Continue crisis management and stabilization.  2. Medication management: Reviewed with patient who stated no untoward effects.  Continue Invega increase to 9 mg daily for psychosis. Increase Minipress to 4 mg at hs for nightmares Increase trazodone to 100 mg QHS.  Celexa 20 mg daily for depression/PTSD symptoms  Cogentin 0.5 mg daily for EPS prevention 3. Encouraged patient to attend groups and participate in group counseling sessions and activities.  4. Discharge plan in progress.  5. Continue current treatment plan.  6. Address health issues: Vitals reviewed  and stable.   Medical Decision Making Problem Points:  Established problem, worsening (2), Review of last therapy session (1) and Review of psycho-social stressors (1) Data Points:  Order Aims Assessment (2) Review of medication regiment & side effects (2) Review of new medications or change in dosage (2)  I certify that inpatient services furnished can reasonably be expected to improve the patient's condition.   Jacqulyn Cane, MD 09/17/2013, 6:22 PM

## 2013-09-17 NOTE — Progress Notes (Signed)
[facility] Group Notes:  (Nursing/MHT/Case Management/Adjunct)  Date:  09/17/2013  Time:  8:00 p.m.   Type of Therapy:  Psychoeducational Skills  Participation Level:  Active  Participation Quality:  Appropriate  Affect:  Appropriate  Cognitive:  Appropriate  Insight:  Appropriate  Engagement in Group:  Developing/Improving  Modes of Intervention:  Education  Summary of Progress/Problems: The patient shared in group that he had a good day since he was able to get some rest. He expressed in group that he typically stops taking his medication as he begins to feel better and then eventually decompensates. He states that he needs to take his medication all of the time and that he also needs to work on his appetite. As a theme for the day, his relapse prevention will include talking to his family about his illness and getting them more involved.   Hazle CocaGOODMAN, Calvin Erickson 09/17/2013, 12:32 AM

## 2013-09-18 NOTE — Progress Notes (Signed)
Patient ID: Calvin Erickson, male   DOB: 07-25-1960, 53 y.o.   MRN: [id number] 09-18-13 @ 1200 noon nursing shift note: D: pt has been visible in the milieu and interacting with his peers. He stated he is still having auditory hallucinations but that the "voices are a whisper at this time". He is denying any hi and visual hallucinations. A: staff continues to probe and support this patient. R: on his inventory sheet he wrote: slept fair, appetite improving, energy low, attention improving with his depression and hopelessness both at 4. He is having some passive SI, but is able to verbally contract. He has had no physical symptoms. After discharge he plans to involve his family in his recovery. RN will monitor and Q 15 checks continue.

## 2013-09-18 NOTE — Progress Notes (Signed)
Psychoeducational Group Note  Date:  09/18/2013 Time:  8:00 p.m.   Group Topic/Focus:  Wrap-Up Group:   The focus of this group is to help patients review their daily goal of treatment and discuss progress on daily workbooks.  Participation Level: Did Not Attend  Participation Quality:  Not Applicable  Affect:  Not Applicable  Cognitive:  Not Applicable  Insight:  Not Applicable  Engagement in Group: Not Applicable  Additional Comments:  The patient didn't attend group since he was asleep in his bed.   Hazle CocaGOODMAN, Danity Schmelzer S 09/18/2013, 9:22 PM

## 2013-09-18 NOTE — Progress Notes (Signed)
Patient ID: Calvin Erickson, male   DOB: 09-24-1960, 53 y.o.   [id number] [facility]  09/18/2013 2:40 PM Calvin Erickson  MRN:  [id number] Subjective:   "I've been doing better with just one hallucination this morning."  Objective: Patient seen and chart is reviewed. The patient reports he has been doing better. He denies any further nightmares , psychosis and depressive symptoms. He continues to have some anxiety. He reports a decrease in  paranoia and  hearing voices which have decreased to whisper but feels his sleep medications need to be increased. He denies homicidal ideation, intent or plan. He states that he like his current medication regimen and would like to stay on it. He denies any adverse reactions to his medication and has been attending the unit milieu. Diagnosis:   DSM5:  Schizophrenia Disorders: Schizophrenia (295.7)  Obsessive-Compulsive Disorders:  Trauma-Stressor Disorders:  Substance/Addictive Disorders:  Depressive Disorders:  AXIS I: Schizophrenia               Post traumatic stress disorder AXIS II: Deferred  AXIS III:  Past Medical History   Diagnosis  Date   .  Hypertension     AXIS IV: occupational problems, other psychosocial or environmental problems and problems related to social environment  AXIS V: 40 impairment in reality testing  ADL's:  Intact  Sleep: fair  Appetite:  Fair  Suicidal Ideation:  Some continued Passive SI with no plan Homicidal Ideation:  Denies AEB (as evidenced by):  Psychiatric Specialty Exam: Physical Exam  Constitutional: He appears well-developed and well-nourished. No distress.  Skin: He is not diaphoretic.    Review of Systems  Constitutional: Negative.  Negative for fever, chills and weight loss.  HENT: Negative.   Eyes: Positive for blurred vision (chronic.). Negative for double vision and photophobia.  Respiratory: Negative.  Negative for cough, hemoptysis, sputum production, shortness of breath and  wheezing.   Cardiovascular: Negative.  Negative for chest pain, palpitations and leg swelling.  Gastrointestinal: Negative.  Negative for heartburn, nausea, vomiting, abdominal pain, diarrhea and constipation.  Genitourinary: Negative.   Musculoskeletal: Negative.   Skin: Negative.  Negative for itching and rash.  Neurological: Negative.  Negative for dizziness, tingling, tremors, sensory change, speech change, focal weakness and seizures.  Endo/Heme/Allergies: Negative.   Psychiatric/Behavioral: Positive for depression, suicidal ideas, hallucinations, memory loss and substance abuse. The patient is nervous/anxious and has insomnia.     Blood pressure 118/80, pulse 82, temperature 97.3 F (36.3 C), temperature source Oral, resp. rate 18, height 5' 4.5" (1.638 m), weight 69.854 kg (154 lb).Body mass index is 26.04 kg/(m^2).  General Appearance: fairly groomed  Patent attorneyye Contact::  Good  Speech:  Clear and Coherent  Volume:  Normal  Mood:  "pretty good."  Affect:  Full Range  Thought Process:  Goal Directed and Intact  Orientation:  Full (Time, Place, and Person)  Thought Content:  Hallucinations: Auditory  Suicidal Thoughts:  Yes.  without intent/plan  Homicidal Thoughts:  No  Memory:  Immediate;   Good Recent;   Fair Remote;   Fair  Judgement:  Fair  Insight:  Present  Psychomotor Activity:  Normal  Concentration:  Fair  Recall:  [employer]Fair  Fund of Knowledge:Fair  Language: Good  Akathisia:  No  Handed:  Right  AIMS (if indicated):     Assets:  Communication Skills Desire for Improvement Physical Health  Sleep:  Number of Hours: 6.25   Musculoskeletal: Strength & Muscle Tone: within normal limits  Gait & Station: normal Patient leans: Right  Current Medications: Current Facility-Administered Medications  Medication Dose Route Frequency Provider Last Rate Last Dose  . alum & mag hydroxide-simeth (MAALOX/MYLANTA) 200-200-20 MG/5ML suspension 30 mL  30 mL Oral Q4H PRN Shuvon Rankin,  NP      . atenolol (TENORMIN) tablet 25 mg  25 mg Oral Daily Mojeed Akintayo   25 mg at 09/18/13 0805  . benztropine (COGENTIN) tablet 0.5 mg  0.5 mg Oral Daily Mojeed Akintayo   0.5 mg at 09/18/13 0806  . chlordiazePOXIDE (LIBRIUM) capsule 25 mg  25 mg Oral TID PRN Shuvon Rankin, NP   25 mg at 09/15/13 1441  . citalopram (CELEXA) tablet 20 mg  20 mg Oral Daily Mojeed Akintayo   20 mg at 09/18/13 0805  . feeding supplement (ENSURE COMPLETE) (ENSURE COMPLETE) liquid 237 mL  237 mL Oral BID BM Mojeed Akintayo   237 mL at 09/18/13 1438  . magnesium hydroxide (MILK OF MAGNESIA) suspension 30 mL  30 mL Oral Daily PRN Shuvon Rankin, NP      . multivitamin with minerals tablet 1 tablet  1 tablet Oral Daily Jeoffrey Massed, RD   1 tablet at 09/18/13 0806  . paliperidone (INVEGA) 24 hr tablet 3 mg  3 mg Oral Daily Larena Sox, MD   3 mg at 09/18/13 0806  . paliperidone (INVEGA) 24 hr tablet 6 mg  6 mg Oral Daily Mojeed Akintayo   6 mg at 09/18/13 0805  . prazosin (MINIPRESS) capsule 4 mg  4 mg Oral QHS Fransisca Kaufmann, NP   4 mg at 09/17/13 2134  . traZODone (DESYREL) tablet 100 mg  100 mg Oral QHS PRN,MR X 1 Larena Sox, MD   100 mg at 09/17/13 2244    Lab Results: No results found for this or any previous visit (from the past 48 hour(s)).  Physical Findings: AIMS:  , ,  ,  ,    CIWA:    COWS:     Treatment Plan Summary: Daily contact with patient to assess and evaluate symptoms and progress in treatment Medication management  Plan: 1. Continue crisis management and stabilization.  2. Medication management: Reviewed with patient who stated no untoward effects.  Continue Invega increase to 9 mg total daily for psychosis.-no change in dose at this time. Continue Minipress to 4 mg at hs for nightmares-no change in dose at this time. Continue trazodone to 100 mg QHS.-no change in dose at this time.  Celexa 20 mg daily for depression/PTSD symptoms-no change in dose at this time.  Cogentin 0.5  mg daily for EPS prevention-no change in dose at this time. 3. Encouraged patient to attend groups and participate in group counseling sessions and activities.  4. Discharge plan in progress.  5. Continue current treatment plan.  6. Address health issues: Vitals reviewed and stable.   Medical Decision Making Problem Points:  Established problem, worsening (2), Review of last therapy session (1) and Review of psycho-social stressors (1) Data Points:  Order Aims Assessment (2) Review of medication regiment & side effects (2) Review of new medications or change in dosage (2)  I certify that inpatient services furnished can reasonably be expected to improve the patient's condition.   Jacqulyn Cane, MD 09/18/2013, 2:40 PM

## 2013-09-18 NOTE — Progress Notes (Signed)
Patient ID: Calvin Erickson, male   DOB: 1961-02-08, 53 y.o.   MRN: [id number] Psychoeducational Group Note  Date:  09/18/2013 Time:  0910am  Group Topic/Focus:  Making Healthy Choices:   The focus of this group is to help patients identify negative/unhealthy choices they were using prior to admission and identify positive/healthier coping strategies to replace them upon discharge.  Participation Level:  Active  Participation Quality:  Appropriate  Affect:  Appropriate  Cognitive:  Hallucinating  Insight:  Supportive  Engagement in Group:  Supportive  Additional Comments:  Inventory group   Calvin Erickson, Calvin Erickson 09/18/2013,11:00 AM

## 2013-09-18 NOTE — Progress Notes (Signed)
Patient ID: Calvin Erickson, male   DOB: 22-Mar-1961, 53 y.o.   MRN: [id number] 09-18-13 @ 1531 nursing shift note: D: pt has been visible in the milieu and interacting with his peers. He is coming to the medication window taking his medications with no adverse effects. A: talking with pt he stated he still has some auditory hallucinations. Staff continues to support and encourage. He is having some passive SI,  but is able to contract for safety. R: on his inventory sheet he wrote slept well, appetite improving, energy low with his depression and hopelessness both at 4. After discharge he plans to "follow his medication schedule better". He also stated that after discharge at home,  he wants to get periodic injections so he will not forget to take his medications. RN will monitor and Q 15 min checks continue.

## 2013-09-18 NOTE — [facility] Group Notes (Signed)
[facility] LCSW Group Therapy  09/18/2013 11:15 to 11:55 AM  Type of Therapy:  Group Therapy  Participation Level:  Active  Participation Quality:  Appropriate, Attentive and Sharing  Affect:  Appropriate  Cognitive:  Appropriate  Insight:  Improving  Engagement in Therapy:  Engaged  Modes of Intervention:  Clarification, Discussion, Limit-setting, Rapport Building, Socialization and Support  Summary of Progress/Problems: The main focus of today's process group was to identify the patient's current support system and explore supports that can be put in place. We began group by each patient sharing something they are looking forward to this year.  An emphasis was placed on using counselor, doctor, therapy groups, 12-step groups, and problem-specific support groups to expand supports. There was also discussion about what constitutes a healthy support versus an unhealthy support. Calvin Erickson shared that he is looking forward to providing support and grooming for his son as that was not something he received.  He is open to adding to his own supports and interested to explore if AA is something he needs/wants and if there are support groups for people who have gone through similar trauma.    Clide DalesHarrill, Catherine Campbell

## 2013-09-18 NOTE — Progress Notes (Signed)
D: Patient in his room on approach.  Patient states he had a good day.  Patient state she is tired tonight.  Patient states he has been taking his medications and they are working well.  Patient states he is passive SI but verbally contracts for safety.  Patient denies HI but states he hears whispers. A: Staff to monitor Q 15 mins for safety.  Encouragement and support offered.  Scheduled medications administered per orders. R: Patient remains safe on the unit.  Patient did not attend group tonight.  Patient taking administered medications.  Patient visible on the unit for snacks and medications tonight.

## 2013-09-18 NOTE — Progress Notes (Signed)
Patient ID: Melanee SpryMark L Ade, male   DOB: Oct 29, 1960, 53 y.o.   MRN: [id number] Psychoeducational Group Note  Date:  09/18/2013 Time:  0930am  Group Topic/Focus:  Making Healthy Choices:   The focus of this group is to help patients identify negative/unhealthy choices they were using prior to admission and identify positive/healthier coping strategies to replace them upon discharge.  Participation Level:  Active  Participation Quality:  Appropriate  Affect:  Appropriate  Cognitive:  Hallucinating  Insight:  Supportive  Engagement in Group:  Supportive  Additional Comments:  Healthy support systems  Valente DavidWeaver, Blimie Vaness Brooks 09/18/2013,11:01 AM

## 2013-09-19 MED ORDER — BENZTROPINE MESYLATE 1 MG PO TABS
1.0000 mg | ORAL_TABLET | Freq: Every day | ORAL | Status: DC
  Administered 2013-09-20: 1 mg via ORAL
  Filled 2013-09-19: qty 3
  Filled 2013-09-19 (×2): qty 1

## 2013-09-19 MED ORDER — ATENOLOL 50 MG PO TABS
50.0000 mg | ORAL_TABLET | Freq: Every day | ORAL | Status: DC
  Administered 2013-09-20: 50 mg via ORAL
  Filled 2013-09-19 (×2): qty 1
  Filled 2013-09-19: qty 3

## 2013-09-19 MED ORDER — PALIPERIDONE PALMITATE 117 MG/0.75ML IM SUSP
117.0000 mg | INTRAMUSCULAR | Status: DC
  Administered 2013-09-20: 117 mg via INTRAMUSCULAR
  Filled 2013-09-19: qty 0.75

## 2013-09-19 MED ORDER — PALIPERIDONE ER 3 MG PO TB24
9.0000 mg | ORAL_TABLET | Freq: Every day | ORAL | Status: DC
  Administered 2013-09-20: 9 mg via ORAL
  Filled 2013-09-19: qty 3
  Filled 2013-09-19: qty 9
  Filled 2013-09-19: qty 3

## 2013-09-19 MED ORDER — PALIPERIDONE PALMITATE 117 MG/0.75ML IM SUSP
117.0000 mg | INTRAMUSCULAR | Status: DC
  Filled 2013-09-19: qty 0.75

## 2013-09-19 NOTE — Progress Notes (Signed)
Patient ID: Calvin Erickson, male   DOB: 1961/03/25, 53 y.o.   MRN: [id number] [facility] MD Progress Note  09/19/2013 10:12 AM Calvin Erickson  MRN:  [id number] Subjective:   Patient states "this is best I have felt in a long time, my medications are working and I am only hearing voices occasionally. I also wants to request to be placed on monthly injection of Invega because I have such a poor memory I can forget to take my pills sometimes.'' Objective: Patient seen and chart is reviewed. Patient endorsed decreased paranoia, psychosis and depressive symptoms. He denies nightmares, vivid dreams and suicidal/homicidal ideation, intent or plan. He is compliant with his medications and  denies any adverse reactions to his medication. He has been attending group therapy and all the unit milieu. Diagnosis:   DSM5:  Schizophrenia Disorders: Schizophrenia (295.7)  Obsessive-Compulsive Disorders:  Trauma-Stressor Disorders:  Substance/Addictive Disorders:  Depressive Disorders:  AXIS I: Schizophrenia               Post traumatic stress disorder AXIS II: Deferred  AXIS III:  Past Medical History   Diagnosis  Date   .  Hypertension     AXIS IV: occupational problems, other psychosocial or environmental problems and problems related to social environment  AXIS V: 40-50 moderate symptoms.  ADL's:  Intact  Sleep: fair  Appetite:  Fair  Suicidal Ideation: denies  Homicidal Ideation:  Denies AEB (as evidenced by):  Psychiatric Specialty Exam: Physical Exam  Psychiatric: His speech is normal. Judgment normal. His mood appears anxious. He is not agitated, not aggressive and not combative. Thought content is paranoid. Cognition and memory are normal. He exhibits a depressed mood. He expresses no homicidal and no suicidal ideation.    Review of Systems  Constitutional: Negative.   HENT: Negative.   Eyes: Negative.   Respiratory: Negative.   Cardiovascular: Negative.   Gastrointestinal: Negative.    Genitourinary: Negative.   Musculoskeletal: Negative.   Skin: Negative.   Neurological: Negative.   Endo/Heme/Allergies: Negative.   Psychiatric/Behavioral: Positive for depression and hallucinations. The patient is nervous/anxious.     Blood pressure 132/93, pulse 73, temperature 98.7 F (37.1 C), temperature source Oral, resp. rate 16, height 5' 4.5" (1.638 m), weight 69.854 kg (154 lb).Body mass index is 26.04 kg/(m^2).  General Appearance: fairly groomed  Patent attorney::  Good  Speech:  Clear and Coherent  Volume:  Normal  Mood:  Anxious  Affect:  Full Range  Thought Process:  Goal Directed and Intact  Orientation:  Full (Time, Place, and Person)  Thought Content:  Hallucinations: Auditory intermittent  Suicidal Thoughts:  denies  Homicidal Thoughts:  No  Memory:  Immediate;   Fair Recent;   Fair Remote;   Fair  Judgement:  Fair  Insight:  Present  Psychomotor Activity:  Normal  Concentration:  Fair  Recall:  [employer] of Knowledge:Fair  Language: Good  Akathisia:  No  Handed:  Right  AIMS (if indicated):     Assets:  Communication Skills Desire for Improvement Physical Health  Sleep:  Number of Hours: 6.75   Musculoskeletal: Strength & Muscle Tone: within normal limits Gait & Station: normal Patient leans: Right  Current Medications: Current Facility-Administered Medications  Medication Dose Route Frequency Provider Last Rate Last Dose  . alum & mag hydroxide-simeth (MAALOX/MYLANTA) 200-200-20 MG/5ML suspension 30 mL  30 mL Oral Q4H PRN Shuvon Rankin, NP      . [START ON 09/20/2013] atenolol (TENORMIN) tablet 50 mg  50 mg Oral Daily Antwan Pandya      . benztropine (COGENTIN) tablet 0.5 mg  0.5 mg Oral Daily Tallyn Holroyd   0.5 mg at 09/19/13 0757  . chlordiazePOXIDE (LIBRIUM) capsule 25 mg  25 mg Oral TID PRN Shuvon Rankin, NP   25 mg at 09/15/13 1441  . citalopram (CELEXA) tablet 20 mg  20 mg Oral Daily Shyna Duignan   20 mg at 09/19/13 0757  . feeding  supplement (ENSURE COMPLETE) (ENSURE COMPLETE) liquid 237 mL  237 mL Oral BID BM Costella Schwarz   237 mL at 09/19/13 1006  . magnesium hydroxide (MILK OF MAGNESIA) suspension 30 mL  30 mL Oral Daily PRN Shuvon Rankin, NP      . multivitamin with minerals tablet 1 tablet  1 tablet Oral Daily Jeoffrey MassedLaura Lee Jobe, RD   1 tablet at 09/19/13 0757  . paliperidone (INVEGA) 24 hr tablet 6 mg  6 mg Oral Daily Anthem Frazer   6 mg at 09/19/13 0757  . prazosin (MINIPRESS) capsule 4 mg  4 mg Oral QHS Fransisca KaufmannLaura Davis, NP   4 mg at 09/18/13 2109  . traZODone (DESYREL) tablet 100 mg  100 mg Oral QHS PRN,MR X 1 Larena SoxShaji J Puthuvel, MD   100 mg at 09/18/13 2109    Lab Results: No results found for this or any previous visit (from the past 48 hour(s)).  Physical Findings: AIMS: Facial and Oral Movements Muscles of Facial Expression: None, normal Lips and Perioral Area: None, normal Jaw: None, normal Tongue: None, normal,Extremity Movements Upper (arms, wrists, hands, fingers): None, normal Lower (legs, knees, ankles, toes): None, normal, Trunk Movements Neck, shoulders, hips: None, normal, Overall Severity Severity of abnormal movements (highest score from questions above): None, normal Incapacitation due to abnormal movements: None, normal Patient's awareness of abnormal movements (rate only patient's report): No Awareness, Dental Status Current problems with teeth and/or dentures?: No Does patient usually wear dentures?: No  CIWA:    COWS:     Treatment Plan Summary: Daily contact with patient to assess and evaluate symptoms and progress in treatment Medication management  Plan: 1. Continue crisis management and stabilization.  2. Medication management: Reviewed with patient who stated no untoward effects.  -Continue Invega 9 mg daily for psychosis. -Continue Minipress  4 mg at hs for nightmares  -Continue Celexa 20 mg daily for depression/PTSD symptoms - Increase  Cogentin to 1 mg daily for EPS  prevention 3. Encouraged patient to attend groups and participate in group counseling sessions and activities.  4. Discharge plan in progress.  5. Continue current treatment plan.  6. Address health issues: Vitals reviewed and stable.  7. Patient agreed to Gean BirchwoodInvega Sustenna 117mg  IM q28 for Paranoid Schizophrenia, 1st dose tomorrow.  Medical Decision Making Problem Points:  Established problem, improving(1), Review of last therapy session (1) and Review of psycho-social stressors (1) Data Points:  Order Aims Assessment (2) Review of medication regiment & side effects (2) Review of new medications or change in dosage (2)  I certify that inpatient services furnished can reasonably be expected to improve the patient's condition.   Thedore MinsAkintayo, Salimah Martinovich, MD 09/19/2013, 10:12 AM

## 2013-09-19 NOTE — Tx Team (Signed)
  Interdisciplinary Treatment Plan Update   Date Reviewed:  09/19/2013  Time Reviewed:  8:09 AM  Progress in Treatment:   Attending groups: Yes Participating in groups: Yes Taking medication as prescribed: Yes  Tolerating medication: Yes Family/Significant other contact made: Yes  Patient understands diagnosis: Yes  Discussing patient identified problems/goals with staff: Yes Medical problems stabilized or resolved: Yes Denies suicidal/homicidal ideation: Yes Patient has not harmed self or others: Yes  For review of initial/current patient goals, please see plan of care.  Estimated Length of Stay:  D/C tomorrow  Reason for Continuation of Hospitalization:   New Problems/Goals identified:  N/A  Discharge Plan or Barriers:   return home, follow up outpt  Additional Comments:  Attendees:  Signature: Thedore MinsMojeed Akintayo, MD 09/19/2013 8:09 AM   Signature: Richelle Itood Destenee Guerry, LCSW 09/19/2013 8:09 AM  Signature: Fransisca KaufmannLaura Davis, NP 09/19/2013 8:09 AM  Signature: Joslyn Devonaroline Beaudry, RN 09/19/2013 8:09 AM  Signature: Liborio NixonPatrice White, RN 09/19/2013 8:09 AM  Signature:  09/19/2013 8:09 AM  Signature:   09/19/2013 8:09 AM  Signature:    Signature:    Signature:    Signature:    Signature:    Signature:      Scribe for Treatment Team:   Richelle Itood Quay Simkin, LCSW  09/19/2013 8:09 AM

## 2013-09-19 NOTE — [facility] Group Notes (Signed)
[facility] LCSW Group Therapy  09/19/2013 1:15 pm  Type of Therapy: Process Group Therapy  Participation Level:  Active  Participation Quality:  Appropriate  Affect:  Flat  Cognitive:  Oriented  Insight:  Improving  Engagement in Group:  Limited  Engagement in Therapy:  Limited  Modes of Intervention:  Activity, Clarification, Education, Problem-solving and Support  Summary of Progress/Problems: Today's group addressed the issue of overcoming obstacles.  Patients were asked to identify their biggest obstacle post d/c that stands in the way of their on-going success, and then problem solve as to how to manage this.  Calvin Erickson shared that his biggest obstacle is taking his meds PO when he is feeling better, which is why he asked for the injection.  His hope is that he will be less inclined to do this in the future with the injection.  He also talked about his reluctance to share openly with others what is going on, and this led to an interesting discussion about stigma and others' perception of mental illness.  Also brought up the issue of choosing carefully who you chose to reveal information to.   Calvin Erickson, Calvin Erickson B 09/19/2013   3:05 PM

## 2013-09-19 NOTE — Care Management Utilization Note (Signed)
   Per State Regulation 482.30  This chart was reviewed for necessity with respect to the patient's Admission/ Duration of stay.  Next review date: 09/22/13  Macy Polio Morrison RN, BSN 

## 2013-09-19 NOTE — [facility] Group Notes (Signed)
[facility]   09/19/2013 10:47 AM  Participation Quality:  Engaged  Mood/Affect:  Appropriate  Depression Rating:    Anxiety Rating:    Thoughts of Suicide:  No Will you contract for safety?   NA  Current AVH:  No  Plan for Discharge/Comments:  [name] entered group having just seen the Dr.  His mood was good, and he is ready to d/c tomorrow.  He is very focused on getting his injection before he leaves.  Transportation Means: family  Supports: family  Kiribatiorth, Baldo DaubRodney B

## 2013-09-19 NOTE — Progress Notes (Signed)
D: Patient in the hallway on approach.  Patient states he was much better today.  Patient states he feels his medications are working.  Patient states he is looking forward to discharge tomorrow.  Patient states he is going to make sure he continues to take his medications.  Patient denies SI/HI and denies AVH. A: Staff to monitor Q 15 mins for safety.  Encouragement and support offered.  Scheduled medications administered per orders. R: Patient remains safe on the unit.  Patient attended group tonight.  Patient visible on the unit and interacting with peers.  Patient taking administered medications.

## 2013-09-19 NOTE — Progress Notes (Signed)
Patient ID: Calvin Erickson, male   DOB: March 04, 1961, 53 y.o.   MRN: [id number] D: patient presents with brighter affect today.  He reports he is sleeping well and his appetite is good.  Patient requests to be weighed to see if he has gained some of his weight back as he was not eating well before admission.  He reports decreased AH and denies SI/HI.  Patient met with treatment team and stated, "I talked to my people to let them know how serious this is.  They would have supported me, but I kept it to myself.  I plan to keep taking my medications and keep my appointments.  I have wanted something done about this for 13 years."  Plan for discharge is for tomorrow.  Patient will also received paliperidone palmitate injection tomorrow before discharge.  Patient is attending groups and participating in his treatment.  He is interacting well with staff and others.  A: continue to monitor medication management and MD orders.  Safety checks completed every 15 minutes per protocol.  R: patient is receptive to staff; he is calm and cooperative.

## 2013-09-19 NOTE — Progress Notes (Signed)
Adult Psychoeducational Group Note  Date:  09/19/2013 Time:  1:57 PM  Group Topic/Focus:  Goals Group:   The focus of this group is to help patients establish daily goals to achieve during treatment and discuss how the patient can incorporate goal setting into their daily lives to aide in recovery.  Participation Level:  Active  Participation Quality:  Appropriate, Attentive and Sharing  Affect:  Appropriate  Cognitive:  Appropriate and Oriented  Insight: Good  Engagement in Group:  Engaged  Modes of Intervention:  Clarification, Discussion, Education and Socialization  Additional Comments:  Pt goal was to "create a new riddle that no one could crack".   Calvin PerchesMercer, Calvin Erickson 09/19/2013, 1:57 PM

## 2013-09-20 MED ORDER — BENZTROPINE MESYLATE 1 MG PO TABS: 1.0000 mg | ORAL_TABLET | Freq: Every day | ORAL | Status: AC

## 2013-09-20 MED ORDER — PRAZOSIN HCL 2 MG PO CAPS: 4.0000 mg | ORAL_CAPSULE | Freq: Every day | ORAL | Status: AC

## 2013-09-20 MED ORDER — TRAZODONE HCL 100 MG PO TABS: 100.0000 mg | ORAL_TABLET | Freq: Every evening | ORAL | Status: AC | PRN

## 2013-09-20 MED ORDER — PALIPERIDONE ER 9 MG PO TB24: 9.0000 mg | ORAL_TABLET | Freq: Every day | ORAL | Status: AC

## 2013-09-20 MED ORDER — ADULT MULTIVITAMIN W/MINERALS CH: 1.0000 | ORAL_TABLET | Freq: Every day | ORAL | Status: AC

## 2013-09-20 MED ORDER — DIPHENHYDRAMINE HCL 50 MG/ML IJ SOLN
50.0000 mg | Freq: Once | INTRAMUSCULAR | Status: AC
  Administered 2013-09-20: 50 mg via INTRAMUSCULAR
  Filled 2013-09-20: qty 1

## 2013-09-20 MED ORDER — CITALOPRAM HYDROBROMIDE 20 MG PO TABS: 20.0000 mg | ORAL_TABLET | Freq: Every day | ORAL | Status: AC

## 2013-09-20 MED ORDER — ATENOLOL 50 MG PO TABS: 50.0000 mg | ORAL_TABLET | Freq: Every day | ORAL | Status: AC

## 2013-09-20 MED ORDER — PALIPERIDONE PALMITATE 117 MG/0.75ML IM SUSP: 117.0000 mg | INTRAMUSCULAR | Status: AC

## 2013-09-20 NOTE — Progress Notes (Signed)
Patient ID: Calvin Erickson, male   DOB: 05-31-1961, 53 y.o.   MRN: [id number] D: patient discharged home per MD order.  Patient received all personal belongings, prescriptions and medication samples.  He denies SI/HI/AVH.  A: patient signed discharge paperwork and indicated understanding of same.  R: patient left ambulatory with family friend.  He plans to follow up with [facility].

## 2013-09-20 NOTE — [facility] Suicide Risk Assessment (Signed)
   Demographic Factors:  Male and African American  Total Time spent with patient: 20 minutes  Psychiatric Specialty Exam: Physical Exam  Psychiatric: He has a normal mood and affect. His speech is normal and behavior is normal. Judgment and thought content normal. Cognition and memory are normal.    Review of Systems  Constitutional: Negative.   HENT: Negative.   Eyes: Negative.   Respiratory: Negative.   Cardiovascular: Negative.   Gastrointestinal: Negative.   Genitourinary: Negative.   Musculoskeletal: Negative.   Skin: Negative.   Neurological: Negative.   Endo/Heme/Allergies: Negative.   Psychiatric/Behavioral: Negative.     Blood pressure 132/90, pulse 78, temperature 96.8 F (36 C), temperature source Oral, resp. rate 20, height 5' 4.5" (1.638 m), weight 69.854 kg (154 lb).Body mass index is 26.04 kg/(m^2).  General Appearance: Fairly Groomed  Patent attorneyye Contact::  Good  Speech:  Clear and Coherent  Volume:  Normal  Mood:  Euthymic  Affect:  Appropriate  Thought Process:  Circumstantial  Orientation:  Full (Time, Place, and Person)  Thought Content:  Negative  Suicidal Thoughts:  No  Homicidal Thoughts:  No  Memory:  Immediate;   Fair Recent;   Fair Remote;   Fair  Judgement:  Fair  Insight:  Good  Psychomotor Activity:  Normal  Concentration:  Fair  Recall:  [employer]  Fund of Knowledge:Good  Language: Good  Akathisia:  No  Handed:  Right  AIMS (if indicated):     Assets:  Communication Skills Physical Health  Sleep:  Number of Hours: 6.75    Musculoskeletal: Strength & Muscle Tone: within normal limits Gait & Station: normal Patient leans: N/A   Mental Status Per Nursing Assessment::   On Admission:     Current Mental Status by Physician: Patient denies suicidal ideation, intent or plan  Loss Factors: NA  Historical Factors: NA  Risk Reduction Factors:   Sense of responsibility to family, Living with another person, especially a relative and  Positive social support  Continued Clinical Symptoms:  Resolving psychosis/mood disorder  Cognitive Features That Contribute To Risk:  Polarized thinking    Suicide Risk:  Minimal: No identifiable suicidal ideation.  Patients presenting with no risk factors but with morbid ruminations; may be classified as minimal risk based on the severity of the depressive symptoms  Discharge Diagnoses:   AXIS I:  Posttraumatic stress disorder              Paranoid schizophrenia AXIS II:  Deferred AXIS III:   Past Medical History  Diagnosis Date  . Hypertension    AXIS IV:  other psychosocial or environmental problems and problems related to social environment AXIS V:  61-70 mild symptoms  Plan Of Care/Follow-up recommendations:  Activity:  as tolerated Diet:  healthy Tests:  routine Other:  patient to keep his after care appointment  Is patient on multiple antipsychotic therapies at discharge:  No   Has Patient had three or more failed trials of antipsychotic monotherapy by history:  No  Recommended Plan for Multiple Antipsychotic Therapies: NA    Steve Gregg,MD 09/20/2013, 10:12 AM

## 2013-09-20 NOTE — Progress Notes (Signed)
Adult Psychoeducational Group Note  Date:  09/20/2013 Time:  0900  Group Topic/Focus:  Wellness Toolbox:   The focus of this group is to discuss various aspects of wellness, balancing those aspects and exploring ways to increase the ability to experience wellness.  Patients will create a wellness toolbox for use upon discharge.  Participation Level:  Active  Participation Quality:  Appropriate, Sharing and Supportive  Affect:  Appropriate  Cognitive:  Alert and Appropriate  Insight: Appropriate and Good  Engagement in Group:  Engaged  Modes of Intervention:  Education and Support  Additional Comments:   Nikolaus Pienta L 09/20/2013, 11:17 AM
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
## 2013-09-23 NOTE — Progress Notes (Signed)
Patient Discharge Instructions:  After Visit Summary (AVS):   Faxed to:  09/23/13 Discharge Summary Note:   Faxed to:  09/23/13 Psychiatric Admission Assessment Note:   Faxed to:  09/23/13 Suicide Risk Assessment - Discharge Assessment:   Faxed to:  09/23/13 Faxed/Sent to the Next Level Care provider:  09/23/13 [facility] @ [phone]  Jerelene ReddenSheena E [place], 09/23/2013, 3:58 PM

## 2023-11-18 ENCOUNTER — Emergency Department (HOSPITAL_COMMUNITY)

## 2023-11-18 ENCOUNTER — Encounter (HOSPITAL_COMMUNITY): Payer: Self-pay

## 2023-11-18 ENCOUNTER — Emergency Department (HOSPITAL_COMMUNITY)
Admission: EM | Admit: 2023-11-18 | Discharge: 2023-11-18 | Disposition: A | Discharge status: Home / Self Care | Attending: Emergency Medicine | Admitting: Emergency Medicine

## 2023-11-18 DIAGNOSIS — I10 Essential (primary) hypertension: Secondary | ICD-10-CM | POA: Insufficient documentation

## 2023-11-18 DIAGNOSIS — M62830 Muscle spasm of back: Secondary | ICD-10-CM | POA: Insufficient documentation

## 2023-11-18 DIAGNOSIS — Z79899 Other long term (current) drug therapy: Secondary | ICD-10-CM | POA: Insufficient documentation

## 2023-11-18 DIAGNOSIS — M549 Dorsalgia, unspecified: Secondary | ICD-10-CM | POA: Diagnosis present

## 2023-11-18 LAB — CBC WITH DIFFERENTIAL/PLATELET
Abs Immature Granulocytes: 0.01 10*3/uL (ref 0.00–0.07)
Basophils Absolute: 0 10*3/uL (ref 0.0–0.1)
Basophils Relative: 1 %
Eosinophils Absolute: 0.1 10*3/uL (ref 0.0–0.5)
Eosinophils Relative: 1 %
HCT: 39.2 % (ref 39.0–52.0)
Hemoglobin: 13.4 g/dL (ref 13.0–17.0)
Immature Granulocytes: 0 %
Lymphocytes Relative: 29 %
Lymphs Abs: 1.1 10*3/uL (ref 0.7–4.0)
MCH: 30 pg (ref 26.0–34.0)
MCHC: 34.2 g/dL (ref 30.0–36.0)
MCV: 87.7 fL (ref 80.0–100.0)
Monocytes Absolute: 0.5 10*3/uL (ref 0.1–1.0)
Monocytes Relative: 12 %
Neutro Abs: 2.3 10*3/uL (ref 1.7–7.7)
Neutrophils Relative %: 57 %
Platelets: 243 10*3/uL (ref 150–400)
RBC: 4.47 MIL/uL (ref 4.22–5.81)
RDW: 13.6 % (ref 11.5–15.5)
WBC: 4 10*3/uL (ref 4.0–10.5)
nRBC: 0 % (ref 0.0–0.2)

## 2023-11-18 LAB — COMPREHENSIVE METABOLIC PANEL WITH GFR
ALT: 15 U/L (ref 0–44)
AST: 29 U/L (ref 15–41)
Albumin: 3.7 g/dL (ref 3.5–5.0)
Alkaline Phosphatase: 67 U/L (ref 38–126)
Anion gap: 10 (ref 5–15)
BUN: 10 mg/dL (ref 6–20)
CO2: 22 mmol/L (ref 22–32)
Calcium: 8.8 mg/dL — ABNORMAL LOW (ref 8.9–10.3)
Chloride: 104 mmol/L (ref 98–111)
Creatinine, Ser: 0.9 mg/dL (ref 0.61–1.24)
GFR, Estimated: >60 mL/min (ref >60)
Glucose, Bld: 86 mg/dL (ref 70–99)
Potassium: 3.5 mmol/L (ref 3.5–5.1)
Sodium: 136 mmol/L (ref 135–145)
Total Bilirubin: 0.8 mg/dL (ref 0.0–1.2)
Total Protein: 6.9 g/dL (ref 6.5–8.1)

## 2023-11-18 MED ORDER — LIDOCAINE 5 % EX PTCH
1.0000 | MEDICATED_PATCH | CUTANEOUS | Status: DC
  Administered 2023-11-18: 1 via TRANSDERMAL
  Filled 2023-11-18: qty 1

## 2023-11-18 MED ORDER — DIAZEPAM 2 MG PO TABS
2.0000 mg | ORAL_TABLET | Freq: Once | ORAL | Status: AC
  Administered 2023-11-18: 2 mg via ORAL
  Filled 2023-11-18: qty 1

## 2023-11-18 MED ORDER — KETOROLAC TROMETHAMINE 15 MG/ML IJ SOLN
15.0000 mg | Freq: Once | INTRAMUSCULAR | Status: AC
  Administered 2023-11-18: 15 mg via INTRAMUSCULAR
  Filled 2023-11-18: qty 1

## 2023-11-18 MED ORDER — METHOCARBAMOL 500 MG PO TABS: 500.0000 mg | ORAL_TABLET | Freq: Two times a day (BID) | ORAL | 0 refills | Status: AC

## 2023-11-18 NOTE — ED Provider Triage Note (Signed)
 Emergency Medicine Provider Triage Evaluation Note  Calvin Erickson , a 63 y.o. male  was evaluated in triage.  Pt complains of pain along the right side of his back, exacerbated when he moves around.  Alleviated when he puts his head down.  Taking BC powders without any improvement in symptoms.  Also endorsing shortness of breath.  Review of Systems  Positive: Sob, back pain Negative: Fever, chest pain  Physical Exam  BP (!) 139/107 (BP Location: Left Arm)   Pulse 86   Temp 98.4 F (36.9 C) (Oral)   Resp 16   Ht 5\' 7"  (1.702 m)   Wt 77.1 kg   SpO2 99%   BMI 26.63 kg/m  Gen:   Awake, no distress   Resp:  Normal effort  MSK:   Moves extremities without difficulty  Other:    Medical Decision Making  Medically screening exam initiated at 4:33 PM.  Appropriate orders placed.  Demetrius Fine was informed that the remainder of the evaluation will be completed by another provider, this initial triage assessment does not replace that evaluation, and the importance of remaining in the ED until their evaluation is complete.     Ewart Carrera, PA-C 11/18/23 1636

## 2023-11-18 NOTE — ED Triage Notes (Addendum)
 Pt c/o R upper back/scapula pain x5 days.  Pain score 10/10.  Pt reports taking BC w/o relief.  Pt denies injury.  Sts he woke up with the pain.  Pt is more comfortable sitting with his head down.  Sts sitting up makes pain worse.    Pt reports "it feels like something is on the inside pushing on my lungs."   Pt reports he started smoke cigars x2 months ago.

## 2023-11-18 NOTE — Discharge Instructions (Signed)
 You had some improvement in your pain with medicines in the emergency department.  I have sent muscle relaxer into the pharmacy for you.  Do not drive or do anything else dangerous after taking this medicine.  Your workup including chest x-ray did not show any concerning findings.  Follow-up with your primary care doctor.

## 2023-11-18 NOTE — ED Provider Notes (Signed)
 Calvin Erickson AT [facility] Provider Note   CSN: [id number] Arrival date & time: 11/18/23  1614     History  Chief Complaint  Patient presents with   Back Pain    Calvin Erickson is a 63 y.o. male.  63 year old male presents today for concern of right upper back pain that started 5 days ago but has progressively worsening.  He works as an Personnel officer.  Denies any chest pain, shortness of breath, any other complaints.  Denies any injury to his back.  No issues with bowel or bladder control.  The history is provided by the patient. No language interpreter was used.       Home Medications Prior to Admission medications   Medication Sig Start Date End Date Taking? Authorizing Provider  methocarbamol (ROBAXIN) 500 MG tablet Take 1 tablet (500 mg total) by mouth 2 (two) times daily. 11/18/23  Yes Dovber Ernest, PA-C  atenolol  (TENORMIN ) 50 MG tablet Take 1 tablet (50 mg total) by mouth daily. 09/20/13   Ferrel Hsu, NP  benztropine  (COGENTIN ) 1 MG tablet Take 1 tablet (1 mg total) by mouth daily. 09/20/13   Ferrel Hsu, NP  citalopram  (CELEXA ) 20 MG tablet Take 1 tablet (20 mg total) by mouth daily. 09/20/13   Ferrel Hsu, NP  Multiple Vitamin (MULTIVITAMIN WITH MINERALS) TABS tablet Take 1 tablet by mouth daily. May purchase over the counter. 09/20/13   Ferrel Hsu, NP  paliperidone  (INVEGA ) 9 MG 24 hr tablet Take 1 tablet (9 mg total) by mouth daily. 09/20/13   Ferrel Hsu, NP  Paliperidone  Palmitate 117 MG/0.75ML SUSP Inject 117 mg into the muscle every 28 (twenty-eight) days. 09/20/13   Ferrel Hsu, NP  prazosin  (MINIPRESS ) 2 MG capsule Take 2 capsules (4 mg total) by mouth at bedtime. 09/20/13   Ferrel Hsu, NP  traZODone  (DESYREL ) 100 MG tablet Take 1 tablet (100 mg total) by mouth at bedtime as needed and may repeat dose one time if needed (sleep). 09/20/13   Ferrel Hsu, NP      Allergies    Vicodin [hydrocodone-acetaminophen ]     Review of Systems   Review of Systems  Constitutional:  Negative for chills and fever.  Respiratory:  Negative for shortness of breath.   Cardiovascular:  Negative for chest pain.  Musculoskeletal:  Positive for back pain.  All other systems reviewed and are negative.   Physical Exam Updated Vital Signs BP (!) 139/107 (BP Location: Left Arm)   Pulse 86   Temp 98.4 F (36.9 C) (Oral)   Resp 16   Ht 5\' 7"  (1.702 m)   Wt 77.1 kg   SpO2 99%   BMI 26.63 kg/m  Physical Exam Vitals and nursing note reviewed.  Constitutional:      General: He is not in acute distress.    Appearance: Normal appearance. He is not ill-appearing.  HENT:     Head: Normocephalic and atraumatic.     Nose: Nose normal.  Eyes:     Conjunctiva/sclera: Conjunctivae normal.  Cardiovascular:     Rate and Rhythm: Normal rate and regular rhythm.  Pulmonary:     Effort: Pulmonary effort is normal. No respiratory distress.  Musculoskeletal:        General: No deformity. Normal range of motion.     Cervical back: Normal range of motion.     Comments: Cervical, Naasz, lumbar spine without tenderness palpation.  Good range of motion bilateral  upper and lower extremities.  Tenderness over right upper paraspinal muscles just below the shoulder blade.  Pain worse with range of motion of the right upper extremity as well as cervical range of motion.  Skin:    Findings: No rash.  Neurological:     Mental Status: He is alert.     ED Results / Procedures / Treatments   Labs (all labs ordered are listed, but only abnormal results are displayed) Labs Reviewed  COMPREHENSIVE METABOLIC PANEL WITH GFR - Abnormal; Notable for the following components:      Result Value   Calcium 8.8 (*)    All other components within normal limits  CBC WITH DIFFERENTIAL/PLATELET    EKG None  Radiology DG Chest 2 View Result Date: 11/18/2023 CLINICAL DATA:  sob EXAM: CHEST - 2 VIEW COMPARISON:  September 01, 2008 FINDINGS: No  focal airspace consolidation, pleural effusion, or pneumothorax. No cardiomegaly. No acute fracture or destructive lesion. Focal degenerative disc disease in the midthoracic spine. IMPRESSION: No acute cardiopulmonary abnormality. Electronically Signed   By: Rance Burrows M.D.   On: 11/18/2023 17:38    Procedures Procedures    Medications Ordered in ED Medications  lidocaine (LIDODERM) 5 % 1 patch (1 patch Transdermal Patch Applied 11/18/23 1933)  ketorolac (TORADOL) 15 MG/ML injection 15 mg (15 mg Intramuscular Given 11/18/23 1934)  diazepam (VALIUM) tablet 2 mg (2 mg Oral Given 11/18/23 1934)    ED Course/ Medical Decision Making/ A&P Clinical Course as of 11/18/23 2002  Wed Nov 18, 2023  1803 DG Chest 2 View [AA]    Clinical Course User Index [AA] Lucina Sabal, PA-C                                 Medical Decision Making Amount and/or Complexity of Data Reviewed Radiology:  Decision-making details documented in ED Course.  Risk Prescription drug management.   Medical Decision Making / ED Course   This patient presents to the ED for concern of back pain, this involves an extensive number of treatment options, and is a complaint that carries with it a high risk of complications and morbidity.  The differential diagnosis includes muscle spasm, muscle strain, ACS, PE, pneumonia  MDM: 25-year-old male presents with above-mentioned complaint.  He works as an Personnel officer.  Exam overall reassuring and likely muscle spasm.  CBC unremarkable.  Chest x-ray without acute cardiopulmonary process.  Multimodal pain control given with significant improvement in symptoms.  Will prescribe Robaxin.  Discharged in stable condition.  Return precaution discussed.   Lab Tests: -I ordered, reviewed, and interpreted labs.   The pertinent results include:   Labs Reviewed  COMPREHENSIVE METABOLIC PANEL WITH GFR - Abnormal; Notable for the following components:      Result Value   Calcium  8.8 (*)    All other components within normal limits  CBC WITH DIFFERENTIAL/PLATELET      EKG  EKG Interpretation Date/Time:    Ventricular Rate:    PR Interval:    QRS Duration:    QT Interval:    QTC Calculation:   R Axis:      Text Interpretation:           Imaging Studies ordered: I ordered imaging studies including chest x-ray I independently visualized and interpreted imaging. I agree with the radiologist interpretation   Medicines ordered and prescription drug management: Meds ordered this encounter  Medications  ketorolac (TORADOL) 15 MG/ML injection 15 mg   lidocaine (LIDODERM) 5 % 1 patch   diazepam (VALIUM) tablet 2 mg   methocarbamol (ROBAXIN) 500 MG tablet    Sig: Take 1 tablet (500 mg total) by mouth 2 (two) times daily.    Dispense:  20 tablet    Refill:  0    Supervising Provider:   Annabell Key, BRIAN [[id number]]    -I have reviewed the patients home medicines and have made adjustments as needed   Reevaluation: After the interventions noted above, I reevaluated the patient and found that they have :stayed the same  Co morbidities that complicate the patient evaluation  Past Medical History:  Diagnosis Date   Anxiety    Depression    Hypertension    Paranoid behavior (HCC)    Post traumatic stress disorder    Schizophrenia (HCC)       Dispostion: Discharged in stable condition.  Return precaution discussed.  Patient voices understanding and is in agreement with plan.  Final Clinical Impression(s) / ED Diagnoses Final diagnoses:  Muscle spasm of back    Rx / DC Orders ED Discharge Orders          Ordered    methocarbamol (ROBAXIN) 500 MG tablet  2 times daily        11/18/23 1944              Bentzion Dauria, PA-C 11/18/23 2011    Mozell Arias, MD 11/18/23 2311

## 2024-01-26 ENCOUNTER — Ambulatory Visit: Admitting: Internal Medicine

## 2024-02-02 ENCOUNTER — Encounter: Payer: Self-pay | Admitting: Internal Medicine

## 2024-02-02 ENCOUNTER — Other Ambulatory Visit: Payer: Self-pay

## 2024-02-02 ENCOUNTER — Ambulatory Visit (INDEPENDENT_AMBULATORY_CARE_PROVIDER_SITE_OTHER): Admitting: Internal Medicine

## 2024-02-02 VITALS — BP 136/84 | HR 85 | Temp 98.6°F | Resp 20 | Ht 66.0 in | Wt 160.8 lb

## 2024-02-02 DIAGNOSIS — L501 Idiopathic urticaria: Secondary | ICD-10-CM

## 2024-02-02 DIAGNOSIS — Z720 Tobacco use: Secondary | ICD-10-CM | POA: Diagnosis not present

## 2024-02-02 NOTE — Patient Instructions (Signed)
 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 Current antihistamine regimen inadequate. Allergy testing needed to rule out allergic causes. Discussed Xolair as a potential treatment, its mechanism, and side effects. - Order allergy testing on August 13th at 9:30 AM. - Discontinue Benadryl , Zyrtec, and Pepcid for 3 days prior to testing. - Increase Zyrtec to 4 tablets per day until testing. - Prescribe Pepcid as an H2 blocker  - Provide pamphlet on Xolair and discuss benefits. - Administer sample dose of Xolair on testing day if desired. - Order [facility] labs: CU panel, IgE, tryptase   Follow up: Return to clinic on August 13th for skin testing (1-68)   - hold all antihistamines for 3 days prior

## 2024-02-02 NOTE — Progress Notes (Signed)
 NEW PATIENT Date of Service/Encounter:  02/02/24 Referring provider: none-self referred Primary care provider: Pcp, No  Subjective:  Calvin Erickson is a 63 y.o. male  presenting today for evaluation of URTICARIA  History obtained from: chart review and patient.   Discussed the use of AI scribe software for clinical note transcription with the patient, who gave verbal consent to proceed.  History of Present Illness Calvin Erickson is a 63 year old male who presents with uncontrolled urticaria.  Chronic urticaria - Hives present since 1986 - Severity of hives has decreased over time, but frequency has increased - Current frequency: every 3-4 days, daily during summer months - Triggers include heat, sweating, physical contact, and alcohol (especially beer) - Hives begin as small bumps that swell, itch, and migrate to different areas of the body  Antihistamine use - Uses Benadryl  and Zyrtec, typically 2 pills per day - Increases antihistamine use during hot weather - Previously used hydroxyzine during severe outbreaks starting in 1986 and continued use for many years, however has been off it for the past few years   Atopic and allergic history - No history of asthma, seasonal allergies, food allergies, medication allergies, bee sting allergies, or eczema  - Rarely ill, with only one or two colds in the past thirty years  Allergy testing - No prior allergy testing performed  Would be open to xolair.   Started smoking 5 months ago, black and milds.  Attributes smoking initiation to stress    Other allergy screening: Asthma: no Rhino conjunctivitis: no Food allergy: no Medication allergy: Vicodin caused throat tightness and itching Hymenoptera allergy: no Urticaria: yes Eczema:no History of recurrent infections suggestive of immunodeficency: no Vaccinations are up to date.   Past Medical History: Past Medical History:  Diagnosis Date   Anxiety    Depression     Hypertension    Paranoid behavior (HCC)    Post traumatic stress disorder    Schizophrenia (HCC)    Medication List:  Current Outpatient Medications  Medication Sig Dispense Refill   atenolol  (TENORMIN ) 50 MG tablet Take 1 tablet (50 mg total) by mouth daily. 30 tablet 0   benztropine  (COGENTIN ) 1 MG tablet Take 1 tablet (1 mg total) by mouth daily. 30 tablet 0   citalopram  (CELEXA ) 20 MG tablet Take 1 tablet (20 mg total) by mouth daily. 30 tablet 0   methocarbamol  (ROBAXIN ) 500 MG tablet Take 1 tablet (500 mg total) by mouth 2 (two) times daily. 20 tablet 0   Multiple Vitamin (MULTIVITAMIN WITH MINERALS) TABS tablet Take 1 tablet by mouth daily. May purchase over the counter.     paliperidone  (INVEGA ) 9 MG 24 hr tablet Take 1 tablet (9 mg total) by mouth daily. 30 tablet 0   Paliperidone  Palmitate 117 MG/0.75ML SUSP Inject 117 mg into the muscle every 28 (twenty-eight) days. 0.9 mL 3   prazosin  (MINIPRESS ) 2 MG capsule Take 2 capsules (4 mg total) by mouth at bedtime. 60 capsule 0   traZODone  (DESYREL ) 100 MG tablet Take 1 tablet (100 mg total) by mouth at bedtime as needed and may repeat dose one time if needed (sleep). 60 tablet 0   No current facility-administered medications for this visit.   Known Allergies:  Allergies  Allergen Reactions   Vicodin [Hydrocodone-Acetaminophen ] Itching and Other (See Comments)    Throat swells   Past Surgical History: Past Surgical History:  Procedure Laterality Date   arm surgery     BACK SURGERY  HERNIA REPAIR     Family History: No family history on file.  ROS:  All other systems negative except as noted per HPI.  Objective:  Blood pressure 136/84, pulse 85, temperature 98.6 F (37 C), temperature source Oral, resp. rate 20, height 5' 6 (1.676 m), weight 160 lb 12.8 oz (72.9 kg), SpO2 99%. Body mass index is 25.95 kg/m. Physical Exam:  General Appearance:  Alert, cooperative, no distress, appears stated age  Head:   Normocephalic, without obvious abnormality, atraumatic  Eyes:  Conjunctiva clear, EOM's intact  Ears EACs normal bilaterally  Nose: Nares normal, normal mucosa, no visible anterior polyps, and septum midline  Throat: Lips, tongue normal; teeth and gums normal, normal posterior oropharynx  Neck: Supple, symmetrical  Lungs:   clear to auscultation bilaterally, Respirations unlabored, no coughing  Heart:  regular rate and rhythm and no murmur, Appears well perfused  Extremities: No edema  Skin: Skin color, texture, turgor normal and no rashes or lesions on visualized portions of skin  Neurologic: No gross deficits   Diagnostics: None done    Labs:  Lab Orders         Chronic Urticaria (CU) Eval         Tryptase         IgE       Assessment and Plan  Assessment and Plan Assessment & Plan Chronic spontaneous urticaria Chronic urticaria since 1986 with frequent hives, exacerbated by heat, sweating, alcohol, and contact. Current antihistamine regimen inadequate. Allergy testing needed to rule out allergic causes. Discussed Xolair as a potential treatment, its mechanism, and side effects. - Order allergy testing on August 13th at 9:30 AM. - Discontinue Benadryl , Zyrtec, and Pepcid for 3 days prior to testing. - Increase Zyrtec to 4 tablets per day until testing. - Prescribe Pepcid as an H2 blocker  - Provide pamphlet on Xolair and discuss benefits. - Administer sample dose of Xolair on testing day if desired. - Order [facility] labs: CU panel, IgE, tryptase   Follow up: Return to clinic on August 13th for skin testing (1-68)   - hold all antihistamines for 3 days prior    This note in its entirety was forwarded to the Provider who requested this consultation.  Other:    Thank you for your kind referral. I appreciate the opportunity to take part in [facility] care. Please do not hesitate to contact me with questions.  Sincerely,  Thank you so much for letting me partake in your care  today.  Don't hesitate to reach out if you have any additional concerns!  Hargis Springer, MD  [facility] [place] [place]

## 2024-02-08 LAB — CHRONIC URTICARIA (CU) EVAL
ALT: 19 IU/L (ref 0–44)
Basophils Absolute: 0 x10E3/uL (ref 0.0–0.2)
Basos: 1 %
CRP: 6 mg/L (ref 0–10)
EOS (ABSOLUTE): 0 x10E3/uL (ref 0.0–0.4)
Eos: 1 %
Hematocrit: 45 % (ref 37.5–51.0)
Hemoglobin: 15 g/dL (ref 13.0–17.7)
Immature Grans (Abs): 0 x10E3/uL (ref 0.0–0.1)
Immature Granulocytes: 0 %
Lymphocytes Absolute: 0.7 x10E3/uL (ref 0.7–3.1)
Lymphs: 17 %
MCH: 30.5 pg (ref 26.6–33.0)
MCHC: 33.3 g/dL (ref 31.5–35.7)
MCV: 92 fL (ref 79–97)
Monocytes Absolute: 0.5 x10E3/uL (ref 0.1–0.9)
Monocytes: 12 %
Neutrophils Absolute: 3 x10E3/uL (ref 1.4–7.0)
Neutrophils: 69 %
Platelets: 308 x10E3/uL (ref 150–450)
Pooled Donor- BAT CU: 2.42 % (ref 0.00–10.60)
RBC: 4.91 x10E6/uL (ref 4.14–5.80)
RDW: 13.8 % (ref 11.6–15.4)
Sed Rate: 18 mm/h (ref 0–30)
TSH: 1.12 u[IU]/mL (ref 0.450–4.500)
Thyroperoxidase Ab SerPl-aCnc: 12 [IU]/mL (ref 0–34)
WBC: 4.3 x10E3/uL (ref 3.4–10.8)

## 2024-02-08 LAB — TRYPTASE: Tryptase: 4.4 ug/L (ref 2.2–13.2)

## 2024-02-08 LAB — IGE: IgE (Immunoglobulin E), Serum: 446 [IU]/mL (ref 6–495)

## 2024-02-10 ENCOUNTER — Ambulatory Visit (INDEPENDENT_AMBULATORY_CARE_PROVIDER_SITE_OTHER): Admitting: Internal Medicine

## 2024-02-10 DIAGNOSIS — L508 Other urticaria: Secondary | ICD-10-CM | POA: Diagnosis not present

## 2024-02-10 DIAGNOSIS — L509 Urticaria, unspecified: Secondary | ICD-10-CM

## 2024-02-10 NOTE — Progress Notes (Signed)
 Date of Service/Encounter:  02/10/24  Allergy  testing appointment   Initial visit on 02/02/24, seen for urticaria .  Please see that note for additional details.  Today reports for allergy  diagnostic testing:    DIAGNOSTICS:  Skin Testing: Environmental allergy  panel and select foods. Adequate positive and negative controls Results discussed with patient/family.  Airborne Adult Perc - 02/10/24 1027     Time Antigen Placed 0930    Allergen Manufacturer Jestine    Location Back    Number of Test 55    1. Control-Buffer 50% Glycerol Negative    2. Control-Histamine 3+    3. Bahia Negative    4. French [place] Negative    5. Johnson Negative    6. [place]  Blue Negative    7. Meadow Fescue Negative    8. Perennial Rye Negative    9. Timothy Negative    10. Ragweed Mix Negative    11. Cocklebur Negative    12. Plantain,  English Negative    13. Baccharis Negative    14. Dog Fennel Negative    15. Russian Thistle Negative    16. Lamb's Quarters Negative    17. Sheep Sorrell Negative    18. Rough Pigweed Negative    19. Marsh Elder, Rough Negative    20. Mugwort, Common Negative    21. Box, Elder Negative    22. Cedar, red Negative    23. Sweet Gum Negative    24. Pecan Pollen Negative    25. Pine Mix Negative    26. Walnut, Black Pollen Negative    27. Red Mulberry Negative    28. Ash Mix Negative    29. Birch Mix Negative    30. Beech American Negative    31. Cottonwood, Guinea[place] Negative    32. Hickory, White Negative    33. Maple Mix Negative    34. Oak, [place] Mix Negative    35. Sycamore Eastern Negative    36. Alternaria Alternata Negative    37. Cladosporium Herbarum Negative    38. Aspergillus Mix 2+    39. Penicillium Mix 2+    40. Bipolaris Sorokiniana (Helminthosporium) Negative    41. Drechslera Spicifera (Curvularia) Negative    42. Mucor Plumbeus Negative    43. Fusarium Moniliforme Negative    44. Aureobasidium Pullulans (pullulara) Negative    45.  Rhizopus Oryzae Negative    46. Botrytis Cinera Negative    47. Epicoccum Nigrum Negative    48. Phoma Betae Negative    49. Dust Mite Mix 2+    50. Cat Hair 10,000 BAU/ml Negative    51.  Dog Epithelia Negative    52. Mixed Feathers Negative    53. Horse Epithelia Negative    54. Cockroach, German Negative    55. Tobacco Leaf Negative          Food Adult Perc - 02/10/24 1000     Time Antigen Placed 0930    Allergen Manufacturer Jestine    Location Back    1. Peanut Negative    2. Soybean Negative    3. Wheat Negative    4. Sesame Negative    5. Milk, Cow Negative    6. Casein Negative    7. Egg White, Chicken Negative    8. Shellfish Mix Negative    9. Fish Mix Negative    10. Cashew Negative    11. Walnut Food Negative    12. Almond Negative    13. Hazelnut Negative  Allergy  testing results were read and interpreted by myself, documented by clinical staff.  Patient provided with copy of allergy  testing along with avoidance measures when indicated.   Hargis Springer, MD  [facility] [place] 

## 2024-02-10 NOTE — Patient Instructions (Signed)
 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 Current antihistamine regimen inadequate. Allergy testing needed to rule out allergic causes. Discussed Xolair as a potential treatment, its mechanism, and side effects. - Allergy test (02/10/24): positive to mold and dust mite   - start avoidance measures  - Continue  Zyrtec to 4 tablets per day until testing. - Continue Pepcid 20mg  1-2 times daily  - Start xolair 300mg  every 4 weeks   - Our biologic coordinator will be reaching out to you about the approval process  - Epipen, action plan provided today   - consent obtained in clinic    Follow up: 6 months   Thank you so much for letting me partake in your care today.  Don't hesitate to reach out if you have any additional concerns!  Hargis Springer, MD  [facility] [place] [place]  Control of Mold Allergen   Mold and fungi can grow on a variety of surfaces provided certain temperature and moisture conditions exist.  Outdoor molds grow on plants, decaying vegetation and soil.  The major outdoor mold, Alternaria and Cladosporium, are found in very high numbers during hot and dry conditions.  Generally, a late Summer - Fall peak is seen for common outdoor fungal spores.  Rain will temporarily lower outdoor mold spore count, but counts rise rapidly when the rainy period ends.  The most important indoor molds are Aspergillus and Penicillium.  Dark, humid and poorly ventilated basements are ideal sites for mold growth.  The next most common sites of mold growth are the bathroom and the kitchen.  Outdoor (Seasonal) Mold Control  Use air conditioning and keep windows closed Avoid exposure to decaying vegetation. Avoid leaf raking. Avoid grain handling. Consider wearing a face mask if working in moldy areas.    Indoor (Perennial) Mold Control   Maintain humidity below 50%. Clean washable  surfaces with 5% bleach solution. Remove sources e.g. contaminated carpets.    DUST MITE AVOIDANCE MEASURES:  There are three main measures that need and can be taken to avoid house dust mites:  Reduce accumulation of dust in general -reduce furniture, clothing, carpeting, books, stuffed animals, especially in bedroom  Separate yourself from the dust -use pillow and mattress encasements (can be found at stores such as [employer] or online) -avoid direct exposure to air condition flow -use a HEPA filter device, especially in the bedroom; you can also use a HEPA filter vacuum cleaner -wipe dust with a moist towel instead of a dry towel or broom when cleaning  Decrease mites and/or their secretions -wash clothing and linen and stuffed animals at highest temperature possible, at least every 2 weeks -stuffed animals can also be placed in a bag and put in a freezer overnight  Despite the above measures, it is impossible to eliminate dust mites or their allergen completely from your home.  With the above measures the burden of mites in your home can be diminished, with the goal of minimizing your allergic symptoms.  Success will be reached only when implementing and using all means together.

## 2024-02-15 ENCOUNTER — Telehealth: Payer: Self-pay | Admitting: *Deleted

## 2024-02-15 MED ORDER — XOLAIR 300 MG/2ML ~~LOC~~ SOSY: 300.0000 mg | PREFILLED_SYRINGE | SUBCUTANEOUS | 11 refills | Status: DC

## 2024-02-15 NOTE — Telephone Encounter (Signed)
-----   Message from Hargis Springer sent at 02/10/2024  1:07 PM EDT ----- Id like to start this patient on xolair  300mg  ever 4 weeks, failed on zyrtec 2-4 times daily and pepcid and with daily hives

## 2024-02-15 NOTE — Telephone Encounter (Signed)
 Called patient to try to find his PBM info but he has not idea. Will need to find info and do auth to try and get Xolair  approval

## 2024-02-16 ENCOUNTER — Other Ambulatory Visit (HOSPITAL_COMMUNITY): Payer: Self-pay

## 2024-02-27 NOTE — Telephone Encounter (Signed)
 Called patient and advised since he has no PDP he will need to through PAP. He will go by clinic and sign consent

## 2024-03-18 NOTE — Telephone Encounter (Signed)
 Still no signed consent

## 2024-04-01 ENCOUNTER — Telehealth: Payer: Self-pay

## 2024-04-01 NOTE — Telephone Encounter (Signed)
 Received [facility] [employer] communication clarification of diagnosis for date of service 02/02/2024 patient of  Dr Lorin. Code provided L50.1 and added L50.9 faxed back to [phone] and sent to scan ceter

## 2024-04-18 NOTE — Telephone Encounter (Signed)
 Never returned PAP application
